# Patient Record
Sex: Male | Born: 1950
Health system: Southern US, Community
[De-identification: ages and names within clinical notes are randomized; demographics above are authoritative.]

## PROBLEM LIST (undated history)

## (undated) DIAGNOSIS — IMO0001 Reserved for inherently not codable concepts without codable children: Secondary | ICD-10-CM

## (undated) DIAGNOSIS — K219 Gastro-esophageal reflux disease without esophagitis: Secondary | ICD-10-CM

## (undated) DIAGNOSIS — Z974 Presence of external hearing-aid: Secondary | ICD-10-CM

## (undated) DIAGNOSIS — G309 Alzheimer's disease, unspecified: Secondary | ICD-10-CM

## (undated) DIAGNOSIS — R37 Sexual dysfunction, unspecified: Secondary | ICD-10-CM

## (undated) DIAGNOSIS — F0280 Dementia in other diseases classified elsewhere without behavioral disturbance: Secondary | ICD-10-CM

## (undated) HISTORY — PX: LUMBAR DISC SURGERY: SHX700

## (undated) HISTORY — DX: Gastro-esophageal reflux disease without esophagitis: K21.9

## (undated) HISTORY — DX: Sexual dysfunction, unspecified: R37

## (undated) HISTORY — PX: TONSILLECTOMY: SUR1361

## (undated) HISTORY — PX: COLONOSCOPY: SHX174

## (undated) HISTORY — PX: NEUROMA SURGERY: SHX722

## (undated) HISTORY — DX: Reserved for inherently not codable concepts without codable children: IMO0001

## (undated) HISTORY — PX: TONSILLECTOMY: SHX5217

---

## 1999-05-10 ENCOUNTER — Encounter (INDEPENDENT_AMBULATORY_CARE_PROVIDER_SITE_OTHER): Payer: Self-pay | Admitting: Specialist

## 1999-05-10 ENCOUNTER — Ambulatory Visit (HOSPITAL_COMMUNITY): Admission: RE | Admit: 1999-05-10 | Discharge: 1999-05-10 | Payer: Self-pay | Admitting: *Deleted

## 2000-04-20 ENCOUNTER — Ambulatory Visit (HOSPITAL_COMMUNITY): Admission: RE | Admit: 2000-04-20 | Discharge: 2000-04-20 | Payer: Self-pay | Admitting: Family Medicine

## 2000-04-20 ENCOUNTER — Encounter: Payer: Self-pay | Admitting: Family Medicine

## 2001-06-28 ENCOUNTER — Ambulatory Visit (HOSPITAL_COMMUNITY): Admission: RE | Admit: 2001-06-28 | Discharge: 2001-06-28 | Payer: Self-pay | Admitting: *Deleted

## 2001-06-28 ENCOUNTER — Encounter (INDEPENDENT_AMBULATORY_CARE_PROVIDER_SITE_OTHER): Payer: Self-pay | Admitting: *Deleted

## 2004-12-04 HISTORY — PX: CARDIOVASCULAR STRESS TEST: SHX262

## 2009-03-08 ENCOUNTER — Encounter: Admission: RE | Admit: 2009-03-08 | Discharge: 2009-03-08 | Payer: Self-pay | Admitting: Family Medicine

## 2009-12-20 ENCOUNTER — Encounter: Payer: Self-pay | Admitting: Cardiovascular Disease

## 2010-08-02 NOTE — Procedures (Signed)
[name]  Patient:    Brian [name], Brian [name]                       MRN: [id number] Proc. Date: 05/10/99 Adm. Date:  [id number] Attending:  Mingo Amber CC:         Meredith Staggers, M.D.                           Procedure Report  PROCEDURES:  Video upper endoscopy and video colonoscopy.  INDICATIONS:  A 60 year old male who reports intermittent rectal bleeding for 6-7 months with what he describes as red blood, clots, and black stool, which go on  intermittently for a few days at a time.  He also has been describing midepigastric pain while taking three maximum strength Bayer aspirin daily for back discomfort. He also drinks a six-pack of beer a day.  Preprocedure hemoglobin was 15.3. Helicobacter pylori antibody test was negative.  PREPARATION:  He was n.p.o. since midnight, having taken Phospho-Soda prep and  clear liquid diet.  The mucosa throughout was clean.  PREPROCEDURE SEDATION:  He received a total of 50 mg of Demerol and 5 mg of Versed prior to the upper endoscopy.  In addition, his throat was anesthetized with Hurricaine spray, and he was on 2 L of nasal cannula O2.  DESCRIPTION OF PROCEDURE:  The Olympus video upper endoscope was inserted via the mouth and advanced easily through the upper esophageal sphincter.  Intubation was then carried out easily to the descending duodenum.  On withdrawal, the mucosa as carefully evaluated.  The descending duodenum and bulb appeared normal.  In the  prepyloric area of the antrum was a 1 cm, healing, non-bleeding ulcer with a white base.  A CLO test was done, and the margins of the ulcer were biopsied.  The remainder of the stomach and retroflexed view of the GE junction appeared normal. On withdrawal through the esophagus, the Z-line was irregular with one eccentric patch of pink mucosa extending up to 38 cm.  This was biopsied to rule-out Barretts.  The remainder of the more  proximal esophagus appeared normal.  At the conclusion of this procedure, the patients position was reversed for procedure number two, a video colonoscopy.  He received an additional 25 mg of Demerol and 2.5 mg of Versed intravenously.  The Olympus video colonoscope was inserted via the rectum and advanced very easily through the entire colon to the cecum.  On withdrawal, the mucosa was carefully evaluated.  The entire right colon appeared normal.  There were minimal left-sided diverticula noted.  Retroflexed view of he rectum demonstrated grade II-III internal hemorrhoids, one of which prolapsed slightly.  There were no areas of inflammation, bleeding, or neoplasia noted. he patient tolerated both procedures well.  Pulse, blood pressure, and oximetry testing were stable throughout.  He was observed in recovery for 45 minutes and  discharged home, alert with a benign abdomen.  IMPRESSION: 1. Prepyloric ulcer of the stomach. 2. Irregular Z-line of the esophagus. 3. Minimal left-sided diverticula. 4. Grade II-III internal hemorrhoids, which likely account for most of his gastrointestinal bleeding.  PLAN:  The patient was given a booklet on hemorrhoidal care and advised to be on a high fiber diet.  He was also started on Protonix 40 mg q.d. and advised to minimize his aspirin intake.  He will return to the office in two weeks for followup. DD:  05/10/99 TD:  05/11/99 Job: [id number]

## 2011-06-06 ENCOUNTER — Encounter: Payer: Self-pay | Admitting: Cardiovascular Disease

## 2011-07-15 ENCOUNTER — Ambulatory Visit (INDEPENDENT_AMBULATORY_CARE_PROVIDER_SITE_OTHER): Payer: 59 | Admitting: Cardiovascular Disease

## 2011-07-15 ENCOUNTER — Encounter: Payer: Self-pay | Admitting: Cardiovascular Disease

## 2011-07-15 VITALS — BP 132/84 | HR 59 | Ht 73.0 in | Wt 206.8 lb

## 2011-07-15 DIAGNOSIS — R079 Chest pain, unspecified: Secondary | ICD-10-CM

## 2011-07-15 DIAGNOSIS — R0789 Other chest pain: Secondary | ICD-10-CM

## 2011-07-15 NOTE — Assessment & Plan Note (Signed)
Dear presents with episodes of chest diabetes the chest pains the past. The chest pain is somewhat disturbing. He was described as a very heavy sensation with radiation to his arm and jaw.   I am Concerned  that he may have some coronary artery disease. We'll schedule him for a stress echo.  I'll see him back in 3 months.

## 2011-07-15 NOTE — Progress Notes (Signed)
    Brian [name] ZOXWRUE Date of Birth  05-14-1950 [facility]     [place] [facility]  [address]    [address]   [address] [place], [place]  [address]    [place], [place]  [address] [phone]  Fax  [phone]  [phone]  Fax [phone]  Problem List: 1. Chest pain  History of Present Illness:  Brian [name] is a 61 y.o. gentleman with hx of CP.  Negative stress test in the past.  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 He was working several weeks ago and was walking the parameter. He developed some chest tightness. He took an aspirin and the pain resolved after about 20 minutes .  He describes the pain as a very heavy weight on his chest. He described it as an elephant on his chest.  The pain radiated up to his right shoulder and up to his right jaw.  He had no shortness of breath nausea or vomiting. He's been up to do all of his normal activities including hardware and has not had any any recurrent chest pain.    His cholesterol levels have been mildly elevated.  He started taking fish oil.    Current Outpatient Prescriptions on File Prior to Visit  Medication Sig Dispense Refill  . Loratadine (ALAVERT PO) Take by mouth daily.      Marland Kitchen NEXIUM 40 MG capsule Take 40 mg by mouth daily before breakfast.       . VIAGRA 100 MG tablet Take 100 mg by mouth as needed.         Allergies  Allergen Reactions  . Amoxicillin     Bothers Stomach    Past Medical History  Diagnosis Date  . Diabetes mellitus   . Reflux   . Sexual dysfunction     Corrected  . Melanotic neuroectodermal tumor     Past Surgical History  Procedure Date  . Tonsillectomy     October of 1969  . Neuroma surgery     Disc Repair  . Cardiovascular stress test 12-04-2004    EF 68%    History  Smoking status  . Never Smoker   Smokeless tobacco  . Not on file    History  Alcohol Use  . Yes    3 Beers per Day    History reviewed. No  pertinent family history.  Reviw of Systems:  Reviewed in the HPI.  All other systems are negative.  Physical Exam: Blood pressure 132/84, pulse 59, height 6\' 1"  (1.854 m), weight 206 lb 12.8 oz (93.804 kg). General: Well developed, well nourished, in no acute distress.  Head: Normocephalic, atraumatic, sclera non-icteric, mucus membranes are moist,   Neck: Supple. Carotids are 2 + without bruits. No JVD  Lungs: Clear bilaterally to auscultation.  Heart: regular rate.  normal  S1 S2. No murmurs, gallops or rubs.  Abdomen: Soft, non-tender, non-distended with normal bowel sounds. No hepatomegaly. No rebound/guarding. No masses.  Msk:  Strength and tone are normal  Extremities: No clubbing or cyanosis. No edema.  Distal pedal pulses are 2+ and equal bilaterally.  Neuro: Alert and oriented X 3. Moves all extremities spontaneously.  Psych:  Responds to questions appropriately with a normal affect.  ECG: 07/15/2011-sinus bradycardia. Otherwise the EKG is normal.  Assessment / Plan:

## 2011-07-15 NOTE — Patient Instructions (Signed)
Your physician recommends that you schedule a follow-up appointment in: 3 months  Your physician has requested that you have a stress echocardiogram.  Please follow instruction sheet as given.

## 2011-08-04 ENCOUNTER — Encounter: Payer: Self-pay | Admitting: Cardiovascular Disease

## 2011-08-04 ENCOUNTER — Ambulatory Visit (HOSPITAL_COMMUNITY): Payer: 59 | Attending: Cardiovascular Disease

## 2011-08-04 DIAGNOSIS — R072 Precordial pain: Secondary | ICD-10-CM

## 2011-08-04 DIAGNOSIS — R0789 Other chest pain: Secondary | ICD-10-CM

## 2011-08-04 DIAGNOSIS — Z8249 Family history of ischemic heart disease and other diseases of the circulatory system: Secondary | ICD-10-CM | POA: Insufficient documentation

## 2011-08-04 DIAGNOSIS — E785 Hyperlipidemia, unspecified: Secondary | ICD-10-CM | POA: Insufficient documentation

## 2011-08-05 ENCOUNTER — Telehealth: Payer: Self-pay | Admitting: Cardiovascular Disease

## 2011-08-05 NOTE — Telephone Encounter (Signed)
App set for  thur 5/ 30, decline sooner app in [place] branch with dr Elease Hashimoto.

## 2011-08-05 NOTE — Telephone Encounter (Signed)
Patient returning nurse call  From yesterday, regarding echo results.  Patient can be reached at [phone].

## 2011-08-05 NOTE — Telephone Encounter (Signed)
Message copied by Antony Odea on Tue Aug 05, 2011  9:25 AM ------      Message from: [place], [place] J      Created: Mon Aug 04, 2011  3:05 PM       The stress echo is suspicious for ischemia.  I think he needs a cardiac cath.  Set him for OV this week or next.

## 2011-08-14 ENCOUNTER — Encounter: Payer: Self-pay | Admitting: Cardiovascular Disease

## 2011-08-14 ENCOUNTER — Encounter: Payer: Self-pay | Admitting: *Deleted

## 2011-08-14 ENCOUNTER — Ambulatory Visit (INDEPENDENT_AMBULATORY_CARE_PROVIDER_SITE_OTHER): Payer: 59 | Admitting: Cardiovascular Disease

## 2011-08-14 VITALS — BP 120/80 | HR 60 | Ht 73.0 in | Wt 205.1 lb

## 2011-08-14 DIAGNOSIS — R9439 Abnormal result of other cardiovascular function study: Secondary | ICD-10-CM

## 2011-08-14 DIAGNOSIS — R079 Chest pain, unspecified: Secondary | ICD-10-CM

## 2011-08-14 LAB — BASIC METABOLIC PANEL
BUN: 17 mg/dL (ref 6–23)
CO2: 27 mEq/L (ref 19–32)
Calcium: 9.1 mg/dL (ref 8.4–10.5)
GFR: 124.46 mL/min (ref >60.00)
Glucose, Bld: 104 mg/dL — ABNORMAL HIGH (ref 70–99)
Sodium: 137 mEq/L (ref 135–145)

## 2011-08-14 LAB — CBC WITH DIFFERENTIAL/PLATELET
Basophils Absolute: 0 10*3/uL (ref 0.0–0.1)
Eosinophils Absolute: 0.1 10*3/uL (ref 0.0–0.7)
HCT: 42 % (ref 39.0–52.0)
Hemoglobin: 14 g/dL (ref 13.0–17.0)
Lymphocytes Relative: 34 % (ref 12.0–46.0)
Lymphs Abs: 1.5 10*3/uL (ref 0.7–4.0)
MCHC: 33.3 g/dL (ref 30.0–36.0)
Monocytes Relative: 10.1 % (ref 3.0–12.0)
Neutro Abs: 2.3 10*3/uL (ref 1.4–7.7)
Platelets: 212 10*3/uL (ref 150.0–400.0)
RDW: 14.1 % (ref 11.5–14.6)

## 2011-08-14 LAB — PROTIME-INR: Prothrombin Time: 10.1 s — ABNORMAL LOW (ref 10.2–12.4)

## 2011-08-14 NOTE — Patient Instructions (Addendum)
Your physician has requested that you have a cardiac catheterization. Cardiac catheterization is used to diagnose and/or treat various heart conditions. Doctors may recommend this procedure for a number of different reasons. The most common reason is to evaluate chest pain. Chest pain can be a symptom of coronary artery disease (CAD), and cardiac catheterization can show whether plaque is narrowing or blocking your heart's arteries. This procedure is also used to evaluate the valves, as well as measure the blood flow and oxygen levels in different parts of your heart. Please follow instruction sheet, as given.   Your physician recommends that you return for lab work in: today, bmet cbc pt/inr

## 2011-08-14 NOTE — Assessment & Plan Note (Addendum)
Brian [name] presents following an episode of chest discomfort. The pain lasted for 20 minutes and was described as an elephant on his chest. He had a stress echocardiogram which was abnormal. He did not have any specific segmental wall motion abnormalities but did not have any increase in his left ventricular function and he  he became very short of breath.  Given these results I think that he needs to have a heart catheterization. We discussed the risks, benefits, and options with hims. We'll draw  labs today. We'll schedule this for next week.

## 2011-08-14 NOTE — Progress Notes (Signed)
Brian [name] NWGNFAO Date of Birth  1951-01-31 [facility]     [place] Office  [address]    [address]   [address] [place], [place]  [address]    [place], [place]  [address] [phone]  Fax  [phone]  [phone]  Fax [phone]  Problem List: 1. Chest pain  History of Present Illness:  Brian [name] is a 61 y.o. gentleman with hx of CP.  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 He was working several weeks ago and was walking the parameter. He developed some chest tightness. He took an aspirin and the pain resolved after about 20 minutes .  He describes the pain as a very heavy weight on his chest. He described it as an elephant on his chest.  The pain radiated up to his right shoulder and up to his right jaw.  He had no shortness of breath nausea or vomiting. He's been up to do all of his normal activities including hardware and has not had any any recurrent chest pain.    His cholesterol levels have been mildly elevated.  He started taking fish oil.    . Had a stress echo since his last visit. He is normal left ventricular systolic function at rest. With exercise his LV  function did not improve. He became very short of breath during the stress test.  He's not had any further episodes of chest pain.  Current Outpatient Prescriptions on File Prior to Visit  Medication Sig Dispense Refill  . Loratadine (ALAVERT PO) Take by mouth daily.      Marland Kitchen NEXIUM 40 MG capsule Take 40 mg by mouth daily before breakfast.       . VIAGRA 100 MG tablet Take 100 mg by mouth as needed.         Allergies  Allergen Reactions  . Amoxicillin     Bothers Stomach    Past Medical History  Diagnosis Date  . Diabetes mellitus   . Reflux   . Sexual dysfunction     Corrected  . Melanotic neuroectodermal tumor     Past Surgical History  Procedure Date  . Tonsillectomy     October of 1969  . Neuroma surgery     Disc Repair  .  Cardiovascular stress test 12-04-2004    EF 68%    History  Smoking status  . Never Smoker   Smokeless tobacco  . Not on file    History  Alcohol Use  . Yes    3 Beers per Day    No family history on file.  Reviw of Systems:  Reviewed in the HPI.  All other systems are negative.  Physical Exam: Blood pressure 120/80, pulse 60, height 6\' 1"  (1.854 m), weight 205 lb 1.9 oz (93.042 kg). General: Well developed, well nourished, in no acute distress.  Head: Normocephalic, atraumatic, sclera non-icteric, mucus membranes are moist,   Neck: Supple. Carotids are 2 + without bruits. No JVD  Lungs: Clear bilaterally to auscultation.  Heart: regular rate.  normal  S1 S2. No murmurs, gallops or rubs.  Abdomen: Soft, non-tender, non-distended with normal bowel sounds. No hepatomegaly. No rebound/guarding. No masses.  Msk:  Strength and tone are normal  Extremities: No clubbing or cyanosis. No edema.  Distal pedal pulses are 2+ and equal bilaterally.  His radial pulses are 2+. His femoral pulses are 2+.  Neuro: Alert and oriented X 3. Moves  all extremities spontaneously.  Psych:  Responds to questions appropriately with a normal affect.  ECG: 07/15/2011-sinus bradycardia. Otherwise the EKG is normal.  Assessment / Plan:

## 2011-08-18 ENCOUNTER — Inpatient Hospital Stay (HOSPITAL_BASED_OUTPATIENT_CLINIC_OR_DEPARTMENT_OTHER)
Admission: RE | Admit: 2011-08-18 | Discharge: 2011-08-18 | Disposition: A | Discharge status: Home / Self Care | Payer: 59 | Source: Ambulatory Visit | Attending: Cardiovascular Disease | Admitting: Cardiovascular Disease

## 2011-08-18 ENCOUNTER — Encounter (HOSPITAL_BASED_OUTPATIENT_CLINIC_OR_DEPARTMENT_OTHER)
Admission: RE | Disposition: A | Discharge status: Home / Self Care | Payer: Self-pay | Source: Ambulatory Visit | Attending: Cardiovascular Disease

## 2011-08-18 DIAGNOSIS — R079 Chest pain, unspecified: Secondary | ICD-10-CM | POA: Insufficient documentation

## 2011-08-18 SURGERY — JV LEFT HEART CATHETERIZATION WITH CORONARY ANGIOGRAM
Anesthesia: Moderate Sedation

## 2011-08-18 MED ORDER — ASPIRIN 81 MG PO CHEW: 324.0000 mg | CHEWABLE_TABLET | ORAL | Status: DC

## 2011-08-18 MED ORDER — SODIUM CHLORIDE 0.9 % IV SOLN: 250.0000 mL | INTRAVENOUS | Status: DC | PRN

## 2011-08-18 MED ORDER — ONDANSETRON HCL 4 MG/2ML IJ SOLN: 4.0000 mg | Freq: Four times a day (QID) | INTRAMUSCULAR | Status: DC | PRN

## 2011-08-18 MED ORDER — SODIUM CHLORIDE 0.9 % IV SOLN
INTRAVENOUS | Status: AC
  Administered 2011-08-18: 08:00:00 via INTRAVENOUS

## 2011-08-18 MED ORDER — ACETAMINOPHEN 325 MG PO TABS: 650.0000 mg | ORAL_TABLET | ORAL | Status: DC | PRN

## 2011-08-18 MED ORDER — SODIUM CHLORIDE 0.9 % IJ SOLN: 3.0000 mL | INTRAMUSCULAR | Status: DC | PRN

## 2011-08-18 MED ORDER — SODIUM CHLORIDE 0.9 % IJ SOLN: 3.0000 mL | Freq: Two times a day (BID) | INTRAMUSCULAR | Status: DC

## 2011-08-18 NOTE — CV Procedure (Signed)
    Cardiac Cath Note  Brian [name] [id number] 1950-09-08  Procedure: Left Heart Cardiac Catheterization Note Indications:  Chest pain, abnormal stress echo  Procedure Details Consent: Obtained Time Out: Verified patient identification, verified procedure, site/side was marked, verified correct patient position, special equipment/implants available, Radiology Safety Procedures followed,  medications/allergies/relevent history reviewed, required imaging and test results available.  Performed   Medications: Fentanyl: 50 mcg IV  Versed: 2 mg IV  The right femoral artery was easily canulated using a modified Seldinger technique.  Hemodynamics:   LV pressure: 115/16 Aortic pressure: 112/56  Angiography   Left Main: smooth and normal.  Left anterior Descending: moderate - large in size, smooth and normal.  1st diagonal - smooth and normal.  Ramus Intermediate:  Large branch. Smooth and normal  Left Circumflex: large and dominant. Smooth and normal.  There are several small obtuse marginal branches that are normal  Right Coronary Artery: small and nondominant.  Smooth and normal.  The RCA gives off several small RV marginal branches that are normal  LV Gram: normal LV function.  EF 60-65 %  Complications: No apparent complications Patient did tolerate procedure well.  Conclusions:   1. Smooth and normal coronaries. 2. Normal LV function.   Brian [name], Brian [name]., MD, [facility] 08/18/2011, 9:04 AM Office - [phone] Pager [phone]

## 2011-08-18 NOTE — H&P (Signed)
  See note from Aug 14, 2011.  Pt with chest pain and abnormal stress echo.  Referred for cath  Discussed risks, benefits, options with patient .  He understands and agrees to proceed.  Vesta Mixer, Montez Hageman., MD, [facility] 08/18/2011, 8:42 AM Office - [phone] Pager [phone]

## 2011-08-26 ENCOUNTER — Encounter: Payer: Self-pay | Admitting: *Deleted

## 2011-09-23 ENCOUNTER — Encounter: Payer: Self-pay | Admitting: Cardiovascular Disease

## 2011-09-23 ENCOUNTER — Ambulatory Visit (INDEPENDENT_AMBULATORY_CARE_PROVIDER_SITE_OTHER): Payer: 59 | Admitting: Cardiovascular Disease

## 2011-09-23 VITALS — BP 130/79 | HR 64 | Ht 73.0 in | Wt 202.8 lb

## 2011-09-23 DIAGNOSIS — R0789 Other chest pain: Secondary | ICD-10-CM

## 2011-09-23 NOTE — Patient Instructions (Addendum)
Your physician wants you to follow-up in: 1 YEAR  You will receive a reminder letter in the mail two months in advance. If you don't receive a letter, please call our office to schedule the follow-up appointment.   Your physician recommends that you return for a FASTING lipid profile: 1 YEAR   

## 2011-09-23 NOTE — Progress Notes (Signed)
Brian [name] OZHYQMV Date of Birth  1950/05/25 [facility]     [place] Office  [address]    [address]   [address] [place], [place]  [address]    [place], [place]  [address] [phone]  Fax  [phone]  [phone]  Fax [phone]  Problem List: 1. Chest pain  History of Present Illness:  Brian [name] is a 61 y.o. gentleman with hx of CP.  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 He was working several weeks ago and was walking the parameter. He developed some chest tightness. He took an aspirin and the pain resolved after about 20 minutes .  He describes the pain as a very heavy weight on his chest. He described it as an elephant on his chest.  The pain radiated up to his right shoulder and up to his right jaw.  He had no shortness of breath nausea or vomiting. He's been up to do all of his normal activities including hardware and has not had any any recurrent chest pain.    His cholesterol levels have been mildly elevated.  He started taking fish oil.   Had a stress echo since his last visit. He is normal left ventricular systolic function at rest. With exercise his LV  function did not improve. He became very short of breath during the stress test.  He's not had any further episodes of chest pain.  Cardiac catheterization (08/18/2011) revealed smooth and normal coronary arteries.  He has normal left ventricular systolic function.  Current Outpatient Prescriptions on File Prior to Visit  Medication Sig Dispense Refill  . aspirin 325 MG tablet Take 325 mg by mouth daily.      . Loratadine (ALAVERT PO) Take by mouth daily.      Marland Kitchen NEXIUM 40 MG capsule Take 40 mg by mouth daily before breakfast.       . VIAGRA 100 MG tablet Take 100 mg by mouth as needed.         Allergies  Allergen Reactions  . Amoxicillin     Bothers Stomach    Past Medical History  Diagnosis Date  . Diabetes mellitus   . Reflux   . Sexual  dysfunction     Corrected  . Melanotic neuroectodermal tumor     Past Surgical History  Procedure Date  . Tonsillectomy     October of 1969  . Neuroma surgery     Disc Repair  . Cardiovascular stress test 12-04-2004    EF 68%    History  Smoking status  . Never Smoker   Smokeless tobacco  . Not on file    History  Alcohol Use  . Yes    3 Beers per Day    No family history on file.  Reviw of Systems:  Reviewed in the HPI.  All other systems are negative.  Physical Exam: Blood pressure 130/79, pulse 64, height 6\' 1"  (1.854 m), weight 202 lb 12.8 oz (91.989 kg). General: Well developed, well nourished, in no acute distress.  Head: Normocephalic, atraumatic, sclera non-icteric, mucus membranes are moist,   Neck: Supple. Carotids are 2 + without bruits. No JVD  Lungs: Clear bilaterally to auscultation.  Heart: regular rate.  normal  S1 S2. No murmurs, gallops or rubs.  Abdomen: Soft, non-tender, non-distended with normal bowel sounds. No hepatomegaly. No rebound/guarding. No masses.  Msk:  Strength and tone are normal  Extremities: No clubbing  or cyanosis. No edema.  Distal pedal pulses are 2+ and equal bilaterally.  His radial pulses are 2+. His femoral pulses are 2+.  Groin has healed normally  Neuro: Alert and oriented X 3. Moves all extremities spontaneously.  Psych:  Responds to questions appropriately with a normal affect.  ECG: 07/15/2011-sinus bradycardia. Otherwise the EKG is normal.  Assessment / Plan:

## 2011-09-23 NOTE — Assessment & Plan Note (Signed)
Derec seems to be doing well. I do not have an exhalation first chest pressure. I'll see him again in one year for followup office visit. We'll probably getting fasting lipids at that time. He'll call if he has any further problems.

## 2011-10-14 ENCOUNTER — Ambulatory Visit: Payer: 59 | Admitting: Cardiovascular Disease

## 2012-03-17 HISTORY — PX: CARDIAC CATHETERIZATION: SHX172

## 2012-09-15 ENCOUNTER — Ambulatory Visit: Payer: 59 | Admitting: Cardiovascular Disease

## 2012-11-19 ENCOUNTER — Encounter: Payer: Self-pay | Admitting: Cardiovascular Disease

## 2012-11-29 ENCOUNTER — Ambulatory Visit: Payer: 59 | Admitting: Cardiovascular Disease

## 2013-02-01 ENCOUNTER — Ambulatory Visit (INDEPENDENT_AMBULATORY_CARE_PROVIDER_SITE_OTHER): Payer: 59 | Admitting: Cardiovascular Disease

## 2013-02-01 ENCOUNTER — Encounter: Payer: Self-pay | Admitting: Cardiovascular Disease

## 2013-02-01 VITALS — BP 140/88 | HR 60 | Ht 73.0 in | Wt 195.0 lb

## 2013-02-01 DIAGNOSIS — R0789 Other chest pain: Secondary | ICD-10-CM

## 2013-02-01 DIAGNOSIS — R9439 Abnormal result of other cardiovascular function study: Secondary | ICD-10-CM

## 2013-02-01 DIAGNOSIS — E785 Hyperlipidemia, unspecified: Secondary | ICD-10-CM

## 2013-02-01 NOTE — Patient Instructions (Signed)
Your physician recommends that you return for lab work in: today   Your physician recommends that you continue on your current medications as directed. Please refer to the Current Medication list given to you today.   Your physician recommends that you schedule a follow-up appointment in: as needed basis

## 2013-02-01 NOTE — Progress Notes (Signed)
Brian [name] ZOXWRUE Date of Birth  Feb 12, 1951 [facility]     [place] Office  [address]    [address]   [address] [place], [place]  [address]    [place], [place]  [address] [phone]  Fax  [phone]  [phone]  Fax [phone]  Problem List: 1. Chest pain  History of Present Illness:  Brian [name] is a 61 y.o. gentleman with hx of CP.  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 He was working several weeks ago and was walking the parameter. He developed some chest tightness. He took an aspirin and the pain resolved after about 20 minutes .  He describes the pain as a very heavy weight on his chest. He described it as an elephant on his chest.  The pain radiated up to his right shoulder and up to his right jaw.  He had no shortness of breath nausea or vomiting. He's been up to do all of his normal activities including hardware and has not had any any recurrent chest pain.    His cholesterol levels have been mildly elevated.  He started taking fish oil.   Had a stress echo since his last visit. He is normal left ventricular systolic function at rest. With exercise his LV  function did not improve. He became very short of breath during the stress test.  He's not had any further episodes of chest pain.  Cardiac catheterization (08/18/2011) revealed smooth and normal coronary arteries.  He has normal left ventricular systolic function.  Nov. 18, 2014:  Brian [name] is doing well.  No further episodes of CP.  Current Outpatient Prescriptions on File Prior to Visit  Medication Sig Dispense Refill  . aspirin 325 MG tablet Take 325 mg by mouth daily.      . Loratadine (ALAVERT PO) Take by mouth daily.      Marland Kitchen NEXIUM 40 MG capsule Take 40 mg by mouth daily before breakfast.       . VIAGRA 100 MG tablet Take 100 mg by mouth as needed.        No current facility-administered medications on file prior to visit.    Allergies  Allergen  Reactions  . Amoxicillin     Bothers Stomach    Past Medical History  Diagnosis Date  . Diabetes mellitus   . Reflux   . Sexual dysfunction     Corrected  . Melanotic neuroectodermal tumor     Past Surgical History  Procedure Laterality Date  . Tonsillectomy      October of 1969  . Neuroma surgery      Disc Repair  . Cardiovascular stress test  12-04-2004    EF 68%    History  Smoking status  . Never Smoker   Smokeless tobacco  . Not on file    History  Alcohol Use  . Yes    Comment: 3 Beers per Day    No family history on file.  Reviw of Systems:  Reviewed in the HPI.  All other systems are negative.  Physical Exam: Blood pressure 140/88, pulse 60, height 6\' 1"  (1.854 m), weight 195 lb (88.451 kg). General: Well developed, well nourished, in no acute distress.  Head: Normocephalic, atraumatic, sclera non-icteric, mucus membranes are moist,   Neck: Supple. Carotids are 2 + without bruits. No JVD  Lungs: Clear bilaterally to auscultation.  Heart: regular rate.  normal  S1 S2. No murmurs, gallops  or rubs.  Abdomen: Soft, non-tender, non-distended with normal bowel sounds. No hepatomegaly. No rebound/guarding. No masses.  Msk:  Strength and tone are normal  Extremities: No clubbing or cyanosis. No edema.  Distal pedal pulses are 2+ and equal bilaterally.  His radial pulses are 2+. His femoral pulses are 2+.  Groin has healed normally  Neuro: Alert and oriented X 3. Moves all extremities spontaneously.  Psych:  Responds to questions appropriately with a normal affect.  ECG: Nov. 18, 2014:  NSR at 60.  Normal ,  Artifact   Assessment / Plan:

## 2013-02-01 NOTE — Assessment & Plan Note (Signed)
Brian [name] has had a normal cath last year.  He reports having a hx of hyperlipidemia at his last blood draw.   Will check fasting lipids, liver, BMP today.    He is no thaving any problems and we will follow up with him as needed.  He will see Dr. Azucena Cecil

## 2013-02-02 LAB — HEPATIC FUNCTION PANEL
AST: 24 U/L (ref 0–37)
Alkaline Phosphatase: 36 U/L — ABNORMAL LOW (ref 39–117)
Bilirubin, Direct: 0.1 mg/dL (ref 0.0–0.3)

## 2013-02-02 LAB — LIPID PANEL
LDL Cholesterol: 95 mg/dL (ref 0–99)
Total CHOL/HDL Ratio: 3

## 2013-02-02 LAB — BASIC METABOLIC PANEL
BUN: 11 mg/dL (ref 6–23)
CO2: 29 mEq/L (ref 19–32)
Calcium: 9.5 mg/dL (ref 8.4–10.5)
Chloride: 102 mEq/L (ref 96–112)
Creatinine, Ser: 0.8 mg/dL (ref 0.4–1.5)

## 2013-02-18 ENCOUNTER — Telehealth: Payer: Self-pay | Admitting: Cardiovascular Disease

## 2013-02-18 NOTE — Telephone Encounter (Signed)
New message ° ° ° °Returning nurses call °

## 2013-02-18 NOTE — Telephone Encounter (Signed)
Spoke with patient who states he had a message to call the office for lab results.  It was documented on the lab results that they were reviewed with the patient on 11/26 but patient denies.  I reviewed labs with patient who verbalized understanding and expressed gratitude.

## 2013-11-11 ENCOUNTER — Other Ambulatory Visit: Payer: Self-pay | Admitting: Family Medicine

## 2013-11-11 DIAGNOSIS — R413 Other amnesia: Secondary | ICD-10-CM

## 2013-11-18 ENCOUNTER — Other Ambulatory Visit: Payer: 59

## 2013-11-22 ENCOUNTER — Ambulatory Visit
Admission: RE | Admit: 2013-11-22 | Discharge: 2013-11-22 | Disposition: A | Discharge status: Home / Self Care | Payer: 59 | Source: Ambulatory Visit | Attending: Family Medicine | Admitting: Family Medicine

## 2013-11-22 DIAGNOSIS — R413 Other amnesia: Secondary | ICD-10-CM

## 2013-11-22 MED ORDER — GADOBENATE DIMEGLUMINE 529 MG/ML IV SOLN
17.0000 mL | Freq: Once | INTRAVENOUS | Status: AC | PRN
  Administered 2013-11-22: 17 mL via INTRAVENOUS

## 2013-12-12 ENCOUNTER — Encounter: Payer: Self-pay | Admitting: Neurology

## 2013-12-12 ENCOUNTER — Ambulatory Visit (INDEPENDENT_AMBULATORY_CARE_PROVIDER_SITE_OTHER): Payer: 59 | Admitting: Neurology

## 2013-12-12 VITALS — BP 150/80 | HR 60 | Resp 16 | Ht 73.25 in | Wt 194.0 lb

## 2013-12-12 DIAGNOSIS — R413 Other amnesia: Secondary | ICD-10-CM

## 2013-12-12 NOTE — Progress Notes (Addendum)
NEUROLOGY CONSULTATION NOTE  BANNER HUCKABA MRN: [id number] DOB: 05/13/1950  Referring provider: Dr. Antony Contras Primary care provider: Dr. Antony Contras  Reason for consult:  Memory loss  Dear Dr Moreen Fowler:  Thank you for your kind referral of Deshan [facility] for consultation of the above symptoms. Although his history is well known to you, please allow me to reiterate it for the purpose of our medical record. The patient was accompanied to the clinic by his wife who also provides collateral information. Records and images were personally reviewed where available.  HISTORY OF PRESENT ILLNESS: This is a very pleasant 63 year old right-handed man with a history of back surgery, melanoma, presenting for evaluation of memory loss over the past 8 months or so.  The patient reports that he has to "dig in my head to pull something out."  He is usually good with names but started forgetting them.  He has not gotten lost driving, but would get turned around, asking his wife how to get to a certain place. 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 He did not pass a qualification test for weapons because of depth perception. He saw his ophthalmologist and underwent cataract surgery in November 2014.  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 He had difficulties following instructions, and felt that the instructor was going so fast. He had a hard time filling out the bubbles on a multiple choice questionnaire. He got hired to work for [employer] last July, but came home one day telling his wife he had problems with his reports, that his supervisor was losing patience with him because he can't seem to do things.  He is currently on medical leave.  His wife  has also noticed that he would ask the same questions repeatedly.  He would say he fed their pets, but actually did not. He would forget to do chores at home such as bringing out the trash.  His wife has been in charge of bills for the past 1-1/2 years as a Lexicographer, they denied any missed bill payments. He states he has always had word-finding difficulties.  His wife became tearful, stating he became depressed after he lost his job in October. He has lost weight and become more quiet. He has also increased alcohol intake, drinking 1-6 beers daily.  They deny any other personality changes, no paranoia.  They note that when they went to the beach last August, he had no difficulties driving long-distance or when they were driving locally, however at home he always asks her how to get to a location, and would get confused/frustrated when it is not the usual route he is used to.    He denies any headaches, dizziness, diplopia, dysarthria, dysphagia, neck pain, bowel/bladder dysfunction. No anosmia, REM behavior disorder. He has mild bilateral hand tremors. He has been dealing with low back pain. He has sleep apnea and uses a CPAP regularly. No history of head injuries or falls. There is no family history of memory problems.  I personally reviewed MRI brain with and without contrast which showed patchy and confluent cerebral white matter T2 and FLAIR hyperintensities bilaterally, periatrial white matter is most affected, more  over the posterior hemispheres. There are multiple small chronic micro hemorrhages identified on series 12 with susceptibility weighted imaging. Mostly these are in the posterior hemispheres. They demonstrate no associated intrinsic T1 hyperintensity or postcontrast enhancement. No abnormal enhancement identified. Mesial temporal lobe structures are symmetric and within normal limits for age.   Laboratory Data: Per patient, B12 level recently checked was normal, results  unavailable for review.  PAST MEDICAL HISTORY: Past Medical History  Diagnosis Date  . Reflux   . Sexual dysfunction     Corrected  . Melanotic neuroectodermal tumor     PAST SURGICAL HISTORY: Past Surgical History  Procedure Laterality Date  . Tonsillectomy      October of 1969  . Neuroma surgery      Disc Repair  . Cardiovascular stress test  12-04-2004    EF 68%  . Lumbar disc surgery      MEDICATIONS: Current Outpatient Prescriptions on File Prior to Visit  Medication Sig Dispense Refill  . aspirin 325 MG tablet Take 325 mg by mouth daily.      . Loratadine (ALAVERT PO) Take by mouth daily.      Marland Kitchen VIAGRA 100 MG tablet Take 100 mg by mouth as needed.        No current facility-administered medications on file prior to visit.    ALLERGIES: Allergies  Allergen Reactions  . Amoxicillin     Bothers Stomach    FAMILY HISTORY: No family history on file.  SOCIAL HISTORY: History   Social History  . Marital Status: Single    Spouse Name: N/A    Number of Children: N/A  . Years of Education: N/A   Occupational History  . Not on file.   Social History Main Topics  . Smoking status: Never Smoker   . Smokeless tobacco: Not on file  . Alcohol Use: Yes     Comment: 3 Beers per Day  . Drug Use: No  . Sexual Activity: Not on file   Other Topics Concern  . Not on file   Social History Narrative  . No narrative on file    REVIEW OF SYSTEMS: Constitutional: No fevers, chills, or sweats, no generalized fatigue, change in appetite Eyes: No visual changes, double vision, eye pain Ear, nose and throat: No hearing loss, ear pain, nasal congestion, sore throat Cardiovascular: No chest pain, palpitations Respiratory:  No shortness of breath at rest or with exertion, wheezes GastrointestinaI: No nausea, vomiting, diarrhea, abdominal pain, fecal incontinence Genitourinary:  No dysuria, urinary retention or frequency Musculoskeletal:  No neck pain, +back  pain Integumentary: No rash, pruritus, skin lesions Neurological: as above Psychiatric: +depression, no insomnia, anxiety Endocrine: No palpitations, fatigue, diaphoresis, mood swings, change in appetite, change in weight, increased thirst Hematologic/Lymphatic:  No anemia, purpura, petechiae. Allergic/Immunologic: no itchy/runny eyes, nasal congestion, recent allergic reactions, rashes  PHYSICAL EXAM: Filed Vitals:   12/12/13 0910  BP: 150/80  Pulse: 60  Resp: 16   General: No acute distress Head:  Normocephalic/atraumatic Eyes: Fundoscopic exam shows bilateral sharp discs, no vessel changes, exudates, or hemorrhages Neck: supple, no paraspinal tenderness, full range of motion Back: No paraspinal tenderness Heart: regular rate and rhythm Lungs: Clear to auscultation bilaterally. Vascular: No carotid bruits. Skin/Extremities: No rash, no edema Neurological Exam: Mental status: alert and oriented to person, place, and time, no dysarthria or aphasia, Fund of knowledge is appropriate.  Remote memory intact.  Attention and concentration are normal.    Able to name objects and repeat  phrases.  Montreal Cognitive Assessment  12/12/2013  Visuospatial/ Executive (0/5) 1  Naming (0/3) 3  Attention: Read list of digits (0/2) 2  Attention: Read list of letters (0/1) 1  Attention: Serial 7 subtraction starting at 100 (0/3) 3  Language: Repeat phrase (0/2) 2  Language : Fluency (0/1) 0  Abstraction (0/2) 2  Delayed Recall (0/5) 0  Orientation (0/6) 5  Total 19  Adjusted Score (based on education) 19   Cranial nerves: CN I: not tested CN II: pupils equal, round and reactive to light, visual fields intact on individual gross field testing, however appears to have visual to double simultaneous stimulation on the left hemifield, fundi unremarkable. CN III, IV, VI:  full range of motion, no nystagmus, no ptosis CN V: facial sensation intact CN VII: upper and lower face symmetric CN VIII:  hearing intact to finger rub CN IX, X: gag intact, uvula midline CN XI: sternocleidomastoid and trapezius muscles intact CN XII: tongue midline Bulk & Tone: normal, no cogwheeling, no fasciculations. Motor: 5/5 throughout with no pronator drift. Sensation: intact to light touch, cold, pin, vibration and joint position sense.  No extinction to double simultaneous stimulation.  Romberg test negative Deep Tendon Reflexes: +1 throughout except for +2 left brachialis, absent ankle jerks bilaterally, no ankle clonus Plantar responses: downgoing bilaterally Cerebellar: no incoordination on finger to nose, heel to shin. No dysdiadochokinesia Gait: narrow-based and steady, able to tandem walk adequately. Tremor: none  IMPRESSION: This is a very pleasant 63 year old right-handed man with a history of back surgery, melanoma, presenting for worsening memory loss over the past 8 months. His neurological exam is non-focal except for possible visual extinction to double simultaneous stimulation on the left hemifield.  MRI brain did not show any acute changes, there were white matter changes seen more over the posterior head regions. MOCA score is 19/30 (normal >26/30), concerning for early onset mild dementia.  We discussed different causes of memory loss, bloodwork from his PCP will be requested for review. We discussed pseudodementia and effects of mood on memory, his wife has noticed more depression since losing his job in October 2014.  Neuropsychological evaluation will be ordered to further evaluate his symptoms and assess if mood is playing a bigger role.  I suspect he may have a dementia variant with the visual extinction noted on exam today.  We discussed the option for starting cholinesterase inhibitors such as Aricept, the side effects and expectations from the medication, and have agreed to hold off for now until after Neuropsych testing.  We discussed the importance of controlling vascular risk factors,  physical and brain stimulation exercises for brain health. He will follow-up in 2 months.  Thank you for allowing me to participate in the care of this patient. Please do not hesitate to call for any questions or concerns.   Ellouise Newer, M.D.  CC: Dr. Moreen Fowler   ADDENDUM 12/21/13: Bloodwork from PCP reviewed, CBC, CMP normal, vitamin B12 448, TSH 1.79.

## 2013-12-12 NOTE — Patient Instructions (Addendum)
1. Refer to Neuropsychological evaluation 2. Physical exercise and brain stimulation exercises are important for brain health 3. Avoid alcohol 4. Monitor your blood pressure regularly, keep a log 5. Follow-up after neuropsych testing

## 2013-12-15 ENCOUNTER — Telehealth: Payer: Self-pay | Admitting: *Deleted

## 2013-12-15 DIAGNOSIS — R413 Other amnesia: Secondary | ICD-10-CM

## 2013-12-15 NOTE — Telephone Encounter (Signed)
Called patient to make him aware that his ins is out of network with Dr. Jenne Campus office. Will refer him for neuropsych testing in [place]. He will receive a call for scheduling.

## 2013-12-15 NOTE — Telephone Encounter (Signed)
Christy from [facility] called in reference to the referral you placed on this patient please call Call back number [phone]

## 2014-02-01 DIAGNOSIS — R413 Other amnesia: Secondary | ICD-10-CM

## 2014-02-02 DIAGNOSIS — R413 Other amnesia: Secondary | ICD-10-CM

## 2014-02-13 ENCOUNTER — Ambulatory Visit (INDEPENDENT_AMBULATORY_CARE_PROVIDER_SITE_OTHER): Payer: 59 | Admitting: Neurology

## 2014-02-13 ENCOUNTER — Encounter: Payer: Self-pay | Admitting: Neurology

## 2014-02-13 VITALS — BP 134/72 | HR 69 | Resp 16 | Ht 73.25 in | Wt 193.0 lb

## 2014-02-13 DIAGNOSIS — F0390 Unspecified dementia without behavioral disturbance: Secondary | ICD-10-CM

## 2014-02-13 MED ORDER — DONEPEZIL HCL 10 MG PO TABS: ORAL_TABLET | ORAL | Status: DC

## 2014-02-13 NOTE — Patient Instructions (Signed)
1. Start Aricept 10mg : Take 1/2 tablet daily for 1 week, then increase to 1 tablet daily 2. Physical exercises and brain stimulation exercises are important for brain health 3. Follow-up in 3 months

## 2014-02-13 NOTE — Progress Notes (Signed)
NEUROLOGY FOLLOW UP OFFICE NOTE  Brian [name] [id number]  HISTORY OF PRESENT ILLNESS: I had the pleasure of seeing Brian [name] in follow-up in the neurology clinic on 02/13/2014.  The patient was last seen 2 months ago and is again accompanied by his wife today. He underwent Neuropsychological testing with results indicating serious impairments in multiple domains. He had prominent impairment of visual-perceptual processing, left-sided visual suppressions to double simultaneous stimulation though no signs of spatial inattention, visual and auditory memory impairment, deficits in verbal fluency, and executive dysfunction. There was no evidence of affective distress. Testing strongly indicated cerebral dysfunction suggestive of a neurodegenerative process, most consistent with a cortical dementia. Recommendations included assistance with more complex daily activities, not to drive until he undergoes a formal on-the-road driving evaluation, monitor mood. It was noted that he is clearly disabled from any type of competitive employment and will be for the forseeable future.   His symptoms are unchanged, he has good and bad days. He reports yesterday was a good day. He denies any headaches, dizziness, focal numbness/tingling/weakness. Sleep is good.  HPI:  This is a very pleasant 63 yo RH man with a history of back surgery, melanoma,with memory loss since the beginning of 2015. The patient reports that he has to "dig in my head to pull something out." He is usually good with names but started forgetting them. He has not gotten lost driving, but would get turned around, asking his wife how to get to a certain place. 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 He did not pass a qualification test for weapons because of depth perception.  He saw his ophthalmologist and underwent cataract surgery in November 2014. 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 He had difficulties following instructions, and felt that the instructor was going so fast. He had a hard time filling out the bubbles on a multiple choice questionnaire. He got hired to work for [employer] last July, but came home one day telling his wife he had problems with his reports, that his supervisor was losing patience with him because he can't seem to do things. He is currently on medical leave. His wife has also noticed that he would ask the same questions repeatedly. He would say he fed their pets, but actually did not. He would forget to do chores at home such as bringing out the trash. His wife has been in charge of bills for the past 1-1/2 years as a Lexicographer, they denied any missed bill payments. He states he has always had word-finding difficulties.No history of head injuries or falls. There is no family history of memory problems.  I personally reviewed MRI brain with and without contrast which showed patchy and confluent cerebral white matter T2 and FLAIR hyperintensities bilaterally, periatrial white matter is most affected, more over the posterior hemispheres. There are multiple small chronic micro hemorrhages identified on series 12 with susceptibility weighted imaging. Mostly these are in the posterior hemispheres. They demonstrate no associated intrinsic T1 hyperintensity or postcontrast enhancement. No abnormal enhancement identified. Mesial temporal lobe structures are symmetric and within normal limits for age.   Laboratory Data: 10/2013 B12 448, 06/2013 TSH 1.79, CBC and CMP normal.  PAST MEDICAL HISTORY: Past Medical History  Diagnosis Date  . Reflux   . Sexual dysfunction     Corrected  . Melanotic neuroectodermal tumor     MEDICATIONS: Current  Outpatient Prescriptions on File Prior to Visit  Medication Sig Dispense Refill  . aspirin 325 MG tablet Take 325 mg by mouth daily.    . Cyanocobalamin (VITAMIN B-12 PO) Take 5,000 mcg by mouth daily.     Marland Kitchen Dexlansoprazole (DEXILANT) 30 MG capsule Take 30 mg by mouth daily.    . Loratadine (ALAVERT PO) Take by mouth daily.    Marland Kitchen VIAGRA 100 MG tablet Take 100 mg by mouth as needed.      No current facility-administered medications on file prior to visit.    ALLERGIES: Allergies  Allergen Reactions  . Amoxicillin     Bothers Stomach    FAMILY HISTORY: No family history on file.  SOCIAL HISTORY: History   Social History  . Marital Status: Single    Spouse Name: N/A    Number of Children: N/A  . Years of Education: N/A   Occupational History  . Not on file.   Social History Main Topics  . Smoking status: Never Smoker   . Smokeless tobacco: Not on file  . Alcohol Use: Yes     Comment: 3 Beers per Day  . Drug Use: No  . Sexual Activity: Not on file   Other Topics Concern  . Not on file   Social History Narrative    REVIEW OF SYSTEMS: Constitutional: No fevers, chills, or sweats, no generalized fatigue, change in appetite Eyes: No visual changes, double vision, eye pain Ear, nose and throat: No hearing loss, ear pain, nasal congestion, sore throat Cardiovascular: No chest pain, palpitations Respiratory:  No shortness of breath at rest or with exertion, wheezes GastrointestinaI: No nausea, vomiting, diarrhea, abdominal pain, fecal incontinence Genitourinary:  No dysuria, urinary retention or frequency Musculoskeletal:  No neck pain, +back pain Integumentary: No rash, pruritus, skin lesions Neurological: as above Psychiatric: No depression, insomnia, anxiety Endocrine: No palpitations, fatigue, diaphoresis, mood swings, change in appetite, change in weight, increased thirst Hematologic/Lymphatic:  No anemia, purpura, petechiae. Allergic/Immunologic: no itchy/runny  eyes, nasal congestion, recent allergic reactions, rashes  PHYSICAL EXAM: Filed Vitals:   02/13/14 0817  BP: 134/72  Pulse: 69  Resp: 16   General: No acute distress Head:  Normocephalic/atraumatic Neck: supple, no paraspinal tenderness, full range of motion Heart:  Regular rate and rhythm Lungs:  Clear to auscultation bilaterally Back: No paraspinal tenderness Skin/Extremities: No rash, no edema Neurological Exam: alert and oriented to person, place, states date is Feb 03, 2014 (it is Nov 30), knows day of week, president. No aphasia or dysarthria. Fund of knowledge is appropriate.  Remote memory intact. 1/3 delayed recall.  Attention and concentration are normal.    Able to name objects and repeat phrases. Cranial nerves: pupils equal, round and reactive to light, visual fields intact on individual gross field testing, however appears to have visual to double simultaneous stimulation on the left hemifield (similar to prior, not consistent but noted again today), fundi unremarkable.CN III, IV, VI: full range of motion, no nystagmus, no ptosis CN V: facial sensation intact CN VII: upper and lower face symmetric CN VIII: hearing intact to voice. CN IX, X: gag intact, uvula midline CN XI: sternocleidomastoid and trapezius muscles intact CN XII: tongue midline Bulk & Tone: normal, no cogwheeling, no fasciculations. Motor: 5/5 throughout with no pronator drift. Sensation: intact  to light touch. No extinction to double simultaneous stimulation. Romberg test negative Deep Tendon Reflexes: +1 throughout, absent ankle jerks bilaterally, no ankle clonus Plantar responses: downgoing bilaterally Cerebellar: no incoordination on finger to nose Gait: narrow-based and steady, able to tandem walk adequately. Tremor: none  IMPRESSION: This is a very pleasant 63 yo RH man with a history of back surgery, melanoma, who presented with worsening memory loss. His neurological exam is non-focal except for possible  visual extinction to double simultaneous stimulation on the left hemifield. He was also noted to have similar left-sided visual suppressions to double simultaneous stimulation during Neuropsych evaluation. MRI brain did not show any acute changes, there were white matter changes seen more over the posterior head regions, which can cause visual spatial perception difficulties, suggestive more of a subcortical dementia, although his Neuropsych evaluation reported results most consistent with a cortical dementia. We discussed the diagnosis of early dementia, expectations and prognosis. He may benefit from starting a cholinesterase inhibitor such as Aricept. Side effects were discussed, he will start Aricept 5mg  daily for 1 week, then increase to 10mg  daily. We again discussed the importance of controlling vascular risk factors, physical and brain stimulation exercises for brain health. He will follow-up in 3 months.  Thank you for allowing me to participate in his care.  Please do not hesitate to call for any questions or concerns.  The duration of this appointment visit was 25 minutes of face-to-face time with the patient.  Greater than 50% of this time was spent in counseling, explanation of diagnosis, planning of further management, and coordination of care.   Ellouise Newer, M.D.   CC: Dr. Moreen Fowler

## 2014-02-15 ENCOUNTER — Encounter: Payer: Self-pay | Admitting: Neurology

## 2014-04-09 ENCOUNTER — Emergency Department (HOSPITAL_COMMUNITY): Payer: 59

## 2014-04-09 ENCOUNTER — Encounter (HOSPITAL_COMMUNITY): Payer: Self-pay | Admitting: Emergency Medicine

## 2014-04-09 ENCOUNTER — Emergency Department (HOSPITAL_COMMUNITY)
Admission: EM | Admit: 2014-04-09 | Discharge: 2014-04-09 | Disposition: A | Discharge status: Home / Self Care | Payer: 59 | Attending: Emergency Medicine | Admitting: Emergency Medicine

## 2014-04-09 DIAGNOSIS — S52592A Other fractures of lower end of left radius, initial encounter for closed fracture: Secondary | ICD-10-CM | POA: Diagnosis not present

## 2014-04-09 DIAGNOSIS — S6992XA Unspecified injury of left wrist, hand and finger(s), initial encounter: Secondary | ICD-10-CM | POA: Diagnosis present

## 2014-04-09 DIAGNOSIS — W1839XA Other fall on same level, initial encounter: Secondary | ICD-10-CM | POA: Diagnosis not present

## 2014-04-09 DIAGNOSIS — K219 Gastro-esophageal reflux disease without esophagitis: Secondary | ICD-10-CM | POA: Insufficient documentation

## 2014-04-09 DIAGNOSIS — Y998 Other external cause status: Secondary | ICD-10-CM | POA: Diagnosis not present

## 2014-04-09 DIAGNOSIS — Z79899 Other long term (current) drug therapy: Secondary | ICD-10-CM | POA: Insufficient documentation

## 2014-04-09 DIAGNOSIS — S52612A Displaced fracture of left ulna styloid process, initial encounter for closed fracture: Secondary | ICD-10-CM | POA: Insufficient documentation

## 2014-04-09 DIAGNOSIS — Z7982 Long term (current) use of aspirin: Secondary | ICD-10-CM | POA: Insufficient documentation

## 2014-04-09 DIAGNOSIS — Y9389 Activity, other specified: Secondary | ICD-10-CM | POA: Diagnosis not present

## 2014-04-09 DIAGNOSIS — Y9289 Other specified places as the place of occurrence of the external cause: Secondary | ICD-10-CM | POA: Diagnosis not present

## 2014-04-09 DIAGNOSIS — G309 Alzheimer's disease, unspecified: Secondary | ICD-10-CM | POA: Insufficient documentation

## 2014-04-09 DIAGNOSIS — S62102A Fracture of unspecified carpal bone, left wrist, initial encounter for closed fracture: Secondary | ICD-10-CM

## 2014-04-09 DIAGNOSIS — Z880 Allergy status to penicillin: Secondary | ICD-10-CM | POA: Diagnosis not present

## 2014-04-09 DIAGNOSIS — S3992XA Unspecified injury of lower back, initial encounter: Secondary | ICD-10-CM | POA: Insufficient documentation

## 2014-04-09 DIAGNOSIS — W19XXXA Unspecified fall, initial encounter: Secondary | ICD-10-CM

## 2014-04-09 HISTORY — DX: Dementia in other diseases classified elsewhere, unspecified severity, without behavioral disturbance, psychotic disturbance, mood disturbance, and anxiety: F02.80

## 2014-04-09 HISTORY — DX: Alzheimer's disease, unspecified: G30.9

## 2014-04-09 NOTE — Discharge Instructions (Signed)
Please call your doctor for a followup appointment within 24-48 hours. When you talk to your doctor please let them know that you were seen in the emergency department and have them acquire all of your records so that they can discuss the findings with you and formulate a treatment plan to fully care for your new and ongoing problems. Please call Dr. Burney Gauze office first thing tomorrow morning to set up an appointment As per Dr. Burney Gauze, reported that surgery most likely occur on Wednesday, 04/12/2014. Please keep arm elevated at all times to help reduction swelling and to prevent compartment syndrome Please ice Please continue to monitor symptoms closely and if symptoms are to worsen or change (fever greater than 101, chills, sweating, nausea, vomiting, chest pain, shortness of breathe, difficulty breathing, weakness, numbness, tingling, worsening or changes to pain pattern, fall, injury, changes to skin colored the fingers, decreased range of motion to the fingers, increased pain) please report back to the Emergency Department immediately.    Cast or Splint Care Casts and splints support injured limbs and keep bones from moving while they heal.  HOME CARE  Keep the cast or splint uncovered during the drying period.  A plaster cast can take 24 to 48 hours to dry.  A fiberglass cast will dry in less than 1 hour.  Do not rest the cast on anything harder than a pillow for 24 hours.  Do not put weight on your injured limb. Do not put pressure on the cast. Wait for your doctor's approval.  Keep the cast or splint dry.  Cover the cast or splint with a plastic bag during baths or wet weather.  If you have a cast over your chest and belly (trunk), take sponge baths until the cast is taken off.  If your cast gets wet, dry it with a towel or blow dryer. Use the cool setting on the blow dryer.  Keep your cast or splint clean. Wash a dirty cast with a damp cloth.  Do not put any objects under  your cast or splint.  Do not scratch the skin under the cast with an object. If itching is a problem, use a blow dryer on a cool setting over the itchy area.  Do not trim or cut your cast.  Do not take out the padding from inside your cast.  Exercise your joints near the cast as told by your doctor.  Raise (elevate) your injured limb on 1 or 2 pillows for the first 1 to 3 days. GET HELP IF:  Your cast or splint cracks.  Your cast or splint is too tight or too loose.  You itch badly under the cast.  Your cast gets wet or has a soft spot.  You have a bad smell coming from the cast.  You get an object stuck under the cast.  Your skin around the cast becomes red or sore.  You have new or more pain after the cast is put on. GET HELP RIGHT AWAY IF:  You have fluid leaking through the cast.  You cannot move your fingers or toes.  Your fingers or toes turn blue or white or are cool, painful, or puffy (swollen).  You have tingling or lose feeling (numbness) around the injured area.  You have bad pain or pressure under the cast.  You have trouble breathing or have shortness of breath.  You have chest pain. Document Released: 07/03/2010 Document Revised: 11/03/2012 Document Reviewed: 09/09/2012 [facility] Patient Information 2015 [place], [place].  This information is not intended to replace advice given to you by your health care provider. Make sure you discuss any questions you have with your health care provider. Wrist Fracture A wrist fracture is a break or crack in one of the bones of your wrist. Your wrist is made up of eight small bones at the palm of your hand (carpal bones) and two long bones that make up your forearm (radius and ulna).  CAUSES   A direct blow to the wrist.  Falling on an outstretched hand.  Trauma, such as a car accident or a fall. RISK FACTORS Risk factors for wrist fracture include:   Participating in contact and high-risk sports, such as skiing,  biking, and ice skating.  Taking steroid medicines.  Smoking.  Being male.  Being Caucasian.  Drinking more than three alcoholic beverages per day.  Having low or lowered bone density (osteoporosis or osteopenia).  Age. Older adults have decreased bone density.  Women who have had menopause.  History of previous fractures. SIGNS AND SYMPTOMS Symptoms of wrist fractures include tenderness, bruising, and inflammation. Additionally, the wrist may hang in an odd position or appear deformed.  DIAGNOSIS Diagnosis may include:  Physical exam.  X-ray. TREATMENT Treatment depends on many factors, including the nature and location of the fracture, your age, and your activity level. Treatment for wrist fracture can be nonsurgical or surgical.  Nonsurgical Treatment A plaster cast or splint may be applied to your wrist if the bone is in a good position. If the fracture is not in good position, it may be necessary for your health care provider to realign it before applying a splint or cast. Usually, a cast or splint will be worn for several weeks.  Surgical Treatment Sometimes the position of the bone is so far out of place that surgery is required to apply a device to hold it together as it heals. Depending on the fracture, there are a number of options for holding the bone in place while it heals, such as a cast and metal pins.  HOME CARE INSTRUCTIONS  Keep your injured wrist elevated and move your fingers as much as possible.  Do not put pressure on any part of your cast or splint. It may break.   Use a plastic bag to protect your cast or splint from water while bathing or showering. Do not lower your cast or splint into water.  Take medicines only as directed by your health care provider.  Keep your cast or splint clean and dry. If it becomes wet, damaged, or suddenly feels too tight, contact your health care provider right away.  Do not use any tobacco products including  cigarettes, chewing tobacco, or electronic cigarettes. Tobacco can delay bone healing. If you need help quitting, ask your health care provider.  Keep all follow-up visits as directed by your health care provider. This is important.  Ask your health care provider if you should take supplements of calcium and vitamins C and D to promote bone healing. SEEK MEDICAL CARE IF:   Your cast or splint is damaged, breaks, or gets wet.  You have a fever.  You have chills.  You have continued severe pain or more swelling than you did before the cast was put on. SEEK IMMEDIATE MEDICAL CARE IF:   Your hand or fingernails on the injured arm turn blue or gray, or feel cold or numb.  You have decreased feeling in the fingers of your injured arm. MAKE SURE  YOU:  Understand these instructions.  Will watch your condition.  Will get help right away if you are not doing well or get worse. Document Released: 12/11/2004 Document Revised: 07/18/2013 Document Reviewed: 03/21/2011 [facility] Patient Information 2015 [place], [place]. This information is not intended to replace advice given to you by your health care provider. Make sure you discuss any questions you have with your health care provider.

## 2014-04-09 NOTE — ED Notes (Signed)
Fell last night, landed on left hand, hx of previous left wrist fracture. Wrist swollen, obvious deformity, no bruising or redness.

## 2014-04-09 NOTE — ED Provider Notes (Addendum)
CSN: [id number]     Arrival date & time 04/09/14  1056 History   First MD Initiated Contact with Patient 04/09/14 1117     Chief Complaint  Patient presents with  . Wrist Injury  . Fall     (Consider location/radiation/quality/duration/timing/severity/associated sxs/prior Treatment) The history is provided by the patient. No language interpreter was used.  Brian [name] is a 64 y/o M with PMHx of reflux, Alzheimer's, Melanotic neuroectodermal tumor presenting to the ED with left wrist pain after patient fell yesterday evening. Patient reported that he was walking his dog and slipped on ice-reported that he tried to break his fall with his left wrist without ability. Stated that he noticed pain to the left wrist-reported deformity as well as constant tenderness. Stated that he noticed tingling in his fingers last p.m. that is now resolved. Wife who accompanies patient reported that they have fused Advil, tramadol, ice and elevation last night. Patient reported that he had a fracture to the same wrist 2 years ago with the same mechanism. Stated that he has some left hip soreness as well. Denied numbness, tingling, loss of sensation, elbow pain, shoulder pain, neck pain, neck stiffness, head injury, loss conscious, blurred vision, sudden loss of vision, chest pain, shortness of breath, difficulty breathing, dizziness, leg pain. PCP Dr. Moreen Fowler   Past Medical History  Diagnosis Date  . Reflux   . Sexual dysfunction     Corrected  . Melanotic neuroectodermal tumor   . Alzheimer disease    Past Surgical History  Procedure Laterality Date  . Tonsillectomy      October of 1969  . Neuroma surgery      Disc Repair  . Cardiovascular stress test  12-04-2004    EF 68%  . Lumbar disc surgery     No family history on file. History  Substance Use Topics  . Smoking status: Never Smoker   . Smokeless tobacco: Not on file  . Alcohol Use: Yes     Comment: 3 Beers per Day    Review of Systems    Constitutional: Negative for fever and chills.  Respiratory: Negative for chest tightness and shortness of breath.   Cardiovascular: Negative for chest pain.  Musculoskeletal: Positive for back pain and arthralgias (left wrist pain). Negative for myalgias.  Neurological: Positive for numbness. Negative for weakness.      Allergies  Amoxicillin  Home Medications   Prior to Admission medications   Medication Sig Start Date End Date Taking? Authorizing Provider  aspirin 325 MG tablet Take 325 mg by mouth daily.   Yes Historical Provider, MD  Cyanocobalamin (VITAMIN B-12 PO) Take 5,000 mcg by mouth daily.    Yes Historical Provider, MD  DEXILANT 60 MG capsule Take 1 capsule by mouth daily. 01/23/14  Yes Historical Provider, MD  donepezil (ARICEPT) 10 MG tablet Take 1/2 tablet daily for 1 week, then increase to 1 tablet daily 02/13/14  Yes Cameron Sprang, MD  VIAGRA 100 MG tablet Take 100 mg by mouth as needed for erectile dysfunction.  06/20/11  Yes Historical Provider, MD  Dexlansoprazole (DEXILANT) 30 MG capsule Take 30 mg by mouth daily.    Historical Provider, MD  Loratadine (ALAVERT PO) Take by mouth daily.    Historical Provider, MD   BP 131/79 mmHg  Pulse 62  Temp(Src) 97.9 F (36.6 C) (Oral)  Resp 16  SpO2 97% Physical Exam  Constitutional: He is oriented to person, place, and time. He appears well-developed and well-nourished.  No distress.  HENT:  Head: Normocephalic and atraumatic.  Eyes: Conjunctivae and EOM are normal. Pupils are equal, round, and reactive to light. Right eye exhibits no discharge. Left eye exhibits no discharge.  Neck: Normal range of motion. Neck supple. No tracheal deviation present.  Negative neck stiffness Negative nuchal rigidity Negative cervical lymphadenopathy Negative tenderness upon palpation to the C-spine  Cardiovascular: Normal rate, regular rhythm and normal heart sounds.  Exam reveals no friction rub.   No murmur heard. Pulses:       Radial pulses are 2+ on the right side, and 2+ on the left side.  Cap refill less than 3 seconds  Pulmonary/Chest: Effort normal and breath sounds normal. No respiratory distress. He has no wheezes. He has no rales.  Musculoskeletal: He exhibits edema and tenderness.       Left wrist: He exhibits decreased range of motion, tenderness, bony tenderness, swelling and deformity. He exhibits no effusion, no crepitus and no laceration.       Arms: Obvious deformity with swelling and ecchymosis identified to the left wrist. Patient holding wrist close to him and refuses to move the wrist secondary to pain. Negative changes to skin color, ischemia identified to the fingers of the left hand. Full range of motion to the digits of the left hand without difficulty. Full range of motion to left elbow, left shoulder without difficulty.  Full range of motion to the lower extremities bilaterally without difficulty or ataxia.  Lymphadenopathy:    He has no cervical adenopathy.  Neurological: He is alert and oriented to person, place, and time. No cranial nerve deficit. He exhibits normal muscle tone. Coordination normal.  Cranial nerves III-XII grossly intact Strength 5+/5+ to upper extremities bilaterally with resistance applied, equal distribution noted Strength intact to MCP, PIP, DIP joints of left hand Sensation intact with differentiation to sharp and dull touch  Skin: Skin is warm and dry. No rash noted. He is not diaphoretic. No erythema.  Psychiatric: He has a normal mood and affect. His behavior is normal. Thought content normal.  Nursing note and vitals reviewed.   ED Course  Procedures (including critical care time) Labs Review Labs Reviewed - No data to display  Imaging Review Dg Lumbar Spine Complete  04/09/2014   CLINICAL DATA:  Low left back pain after falling yesterday. Initial encounter.  EXAM: LUMBAR SPINE - COMPLETE 4+ VIEW  COMPARISON:  Office radiographs 03/29/2009.  Lumbar MRI  03/08/2009.  FINDINGS: There are 5 lumbar type vertebral bodies. The alignment is stable and near anatomic. There is no evidence of acute fracture or pars defect. Chronic disc space loss is present at L5-S1 with endplate sclerosis and vacuum phenomenon. There are probable postsurgical changes posteriorly at L4-5 and L5-S1.  IMPRESSION: No acute osseous findings or malalignment. Stable lower lumbar spondylosis and postsurgical changes.   Electronically Signed   By: Camie Patience M.D.   On: 04/09/2014 12:05   Dg Wrist Complete Left  04/09/2014   CLINICAL DATA:  Fall last night with pain of left wrist.  EXAM: LEFT WRIST - COMPLETE 3+ VIEW  COMPARISON:  None.  FINDINGS: Acute comminuted fracture of the distal radial metaphysis extends into the radiocarpal joint. This fracture shows mild dorsal angulation as well as impaction. Associated avulsion of the tip of the ulnar styloid with minimal displacement. No other injuries identified.  IMPRESSION: Comminuted fracture of the left distal radial metaphysis which extends into the radiocarpal joint and shows mild dorsal angulation. Associated avulsion fracture  at the tip of the ulnar styloid.   Electronically Signed   By: Aletta Edouard M.D.   On: 04/09/2014 12:00   Dg Hip Unilat With Pelvis 2-3 Views Left  04/09/2014   CLINICAL DATA:  Fall last night with left hip pain. Initial encounter.  EXAM: DG HIP W/ PELVIS 2-3V*L*  COMPARISON:  None.  FINDINGS: No acute fracture or dislocation identified. The left hip joint shows no significant degenerative changes. The bony pelvis has a normal appearance. No bony lesions or destruction. Soft tissues are unremarkable.  IMPRESSION: No acute fracture identified.   Electronically Signed   By: Aletta Edouard M.D.   On: 04/09/2014 12:03     EKG Interpretation None       12:49 PM This provider spoke with Dr. Burney Gauze - hand specialist. Discussed case, imaging in great detail. As per physician, recommended patient be placed in  sugar tong splint and for patient to call office tomorrow morning. Reported that patient will need surgery for corrective repair-reported surgery will most likely be done on Wednesday.  MDM   Final diagnoses:  Fall  Left wrist fracture, closed, initial encounter    Medications - No data to display  Filed Vitals:   04/09/14 1101  BP: 131/79  Pulse: 62  Temp: 97.9 F (36.6 C)  TempSrc: Oral  Resp: 16  SpO2: 97%   Plain film of lumbar spine negative for acute osseous injury or malalignment. Lower lumbar spondylosis and postsurgical changes. Plain film of left hip no acute osseous injury noted. Left wrist noted comminuted fracture of the left distal radial metaphysis which extends into the radiocarpal joint and shows mild dorsal angulation-associated avulsion fracture at the tip of the ulnar styloid noted.  This provider spoke with Dr. Burney Gauze, on-call physician for hand. As per physician, recommended patient be placed in sugar tong splint and follow-up as outpatient. Reported that surgery will most likely commence on Wednesday of this week. Negative signs of ischemia. Negative signs of compartment syndrome. Negative focal neurological deficits identified. Cap refill less than 3 seconds. Pulses palpable and strong.Patient presenting to the ED with comminuted fracture with distal angulation to the left wrist. Patient placed in sugar tong splint and sling immobilizer by [employer]. Patient stable, afebrile. Patient not septic appearing. Discharged patient. Discussed with patient to call Dr. Burney Gauze office first thing tomorrow morning to set up an appointment - hand specialist is aware of patient and situation. Reported that this will need to be repaired with surgery-patient understood and agreed to plan. Discussed with patient to rest, ice, elevate. Discussed with patient to closely monitor symptoms and if symptoms are to worsen or change to report back to the ED - strict return instructions given.   Patient agreed to plan of care, understood, all questions answered.   Jamse Mead, PA-C 04/09/14 1354  Malvin Johns, MD 04/09/14 [address] PA-C 04/21/14 [place], MD 04/21/14 1650

## 2014-04-11 ENCOUNTER — Encounter (HOSPITAL_BASED_OUTPATIENT_CLINIC_OR_DEPARTMENT_OTHER): Payer: Self-pay | Admitting: *Deleted

## 2014-04-11 ENCOUNTER — Other Ambulatory Visit: Payer: Self-pay | Admitting: Orthopedic Surgery

## 2014-04-11 NOTE — Progress Notes (Signed)
Pt has mild memory loss-no labs needed

## 2014-04-12 ENCOUNTER — Ambulatory Visit (HOSPITAL_BASED_OUTPATIENT_CLINIC_OR_DEPARTMENT_OTHER): Payer: 59 | Admitting: Certified Registered"

## 2014-04-12 ENCOUNTER — Ambulatory Visit (HOSPITAL_BASED_OUTPATIENT_CLINIC_OR_DEPARTMENT_OTHER)
Admission: RE | Admit: 2014-04-12 | Discharge: 2014-04-12 | Disposition: A | Discharge status: Home / Self Care | Payer: 59 | Source: Ambulatory Visit | Attending: Orthopedic Surgery | Admitting: Orthopedic Surgery

## 2014-04-12 ENCOUNTER — Encounter (HOSPITAL_BASED_OUTPATIENT_CLINIC_OR_DEPARTMENT_OTHER): Payer: Self-pay | Admitting: Certified Registered"

## 2014-04-12 ENCOUNTER — Encounter (HOSPITAL_BASED_OUTPATIENT_CLINIC_OR_DEPARTMENT_OTHER)
Admission: RE | Disposition: A | Discharge status: Home / Self Care | Payer: Self-pay | Source: Ambulatory Visit | Attending: Orthopedic Surgery

## 2014-04-12 DIAGNOSIS — Y9389 Activity, other specified: Secondary | ICD-10-CM | POA: Insufficient documentation

## 2014-04-12 DIAGNOSIS — G309 Alzheimer's disease, unspecified: Secondary | ICD-10-CM | POA: Insufficient documentation

## 2014-04-12 DIAGNOSIS — Z881 Allergy status to other antibiotic agents status: Secondary | ICD-10-CM | POA: Insufficient documentation

## 2014-04-12 DIAGNOSIS — Y998 Other external cause status: Secondary | ICD-10-CM | POA: Diagnosis not present

## 2014-04-12 DIAGNOSIS — F0280 Dementia in other diseases classified elsewhere without behavioral disturbance: Secondary | ICD-10-CM | POA: Insufficient documentation

## 2014-04-12 DIAGNOSIS — S52572A Other intraarticular fracture of lower end of left radius, initial encounter for closed fracture: Secondary | ICD-10-CM | POA: Diagnosis not present

## 2014-04-12 DIAGNOSIS — W19XXXA Unspecified fall, initial encounter: Secondary | ICD-10-CM | POA: Insufficient documentation

## 2014-04-12 DIAGNOSIS — K219 Gastro-esophageal reflux disease without esophagitis: Secondary | ICD-10-CM | POA: Insufficient documentation

## 2014-04-12 DIAGNOSIS — Y9289 Other specified places as the place of occurrence of the external cause: Secondary | ICD-10-CM | POA: Diagnosis not present

## 2014-04-12 HISTORY — PX: CARPAL TUNNEL RELEASE: SHX101

## 2014-04-12 HISTORY — PX: OPEN REDUCTION INTERNAL FIXATION (ORIF) DISTAL RADIAL FRACTURE: SHX5989

## 2014-04-12 HISTORY — DX: Presence of external hearing-aid: Z97.4

## 2014-04-12 SURGERY — OPEN REDUCTION INTERNAL FIXATION (ORIF) DISTAL RADIUS FRACTURE
Anesthesia: General | Site: Wrist | Laterality: Left

## 2014-04-12 MED ORDER — LIDOCAINE-EPINEPHRINE (PF) 1.5 %-1:200000 IJ SOLN
INTRAMUSCULAR | Status: DC | PRN
  Administered 2014-04-12: 25 mL via PERINEURAL

## 2014-04-12 MED ORDER — DEXAMETHASONE SODIUM PHOSPHATE 10 MG/ML IJ SOLN
INTRAMUSCULAR | Status: DC | PRN
Start: 2014-04-12 — End: 2014-04-12
  Administered 2014-04-12: 10 mg via INTRAVENOUS

## 2014-04-12 MED ORDER — FENTANYL CITRATE 0.05 MG/ML IJ SOLN
INTRAMUSCULAR | Status: AC
  Filled 2014-04-12: qty 6

## 2014-04-12 MED ORDER — FENTANYL CITRATE 0.05 MG/ML IJ SOLN
50.0000 ug | INTRAMUSCULAR | Status: DC | PRN
  Administered 2014-04-12: 50 ug via INTRAVENOUS

## 2014-04-12 MED ORDER — LACTATED RINGERS IV SOLN
INTRAVENOUS | Status: DC
  Administered 2014-04-12 (×2): via INTRAVENOUS

## 2014-04-12 MED ORDER — VANCOMYCIN HCL IN DEXTROSE 1-5 GM/200ML-% IV SOLN
1000.0000 mg | INTRAVENOUS | Status: AC
  Administered 2014-04-12: 1000 mg via INTRAVENOUS

## 2014-04-12 MED ORDER — HYDROMORPHONE HCL 1 MG/ML IJ SOLN: 0.2500 mg | INTRAMUSCULAR | Status: DC | PRN

## 2014-04-12 MED ORDER — PROMETHAZINE HCL 25 MG/ML IJ SOLN: 6.2500 mg | INTRAMUSCULAR | Status: DC | PRN

## 2014-04-12 MED ORDER — MIDAZOLAM HCL 2 MG/2ML IJ SOLN
1.0000 mg | INTRAMUSCULAR | Status: DC | PRN
  Administered 2014-04-12: 0.5 mg via INTRAVENOUS

## 2014-04-12 MED ORDER — PROPOFOL 10 MG/ML IV BOLUS
INTRAVENOUS | Status: DC | PRN
  Administered 2014-04-12: 200 mg via INTRAVENOUS

## 2014-04-12 MED ORDER — FENTANYL CITRATE 0.05 MG/ML IJ SOLN
INTRAMUSCULAR | Status: AC
  Filled 2014-04-12: qty 2

## 2014-04-12 MED ORDER — VANCOMYCIN HCL IN DEXTROSE 1-5 GM/200ML-% IV SOLN
INTRAVENOUS | Status: AC
  Filled 2014-04-12: qty 200

## 2014-04-12 MED ORDER — BUPIVACAINE-EPINEPHRINE (PF) 0.5% -1:200000 IJ SOLN
INTRAMUSCULAR | Status: DC | PRN
  Administered 2014-04-12: 30 mL via PERINEURAL

## 2014-04-12 MED ORDER — MIDAZOLAM HCL 2 MG/2ML IJ SOLN
INTRAMUSCULAR | Status: AC
Start: 2014-04-12 — End: 2014-04-12
  Filled 2014-04-12: qty 2

## 2014-04-12 MED ORDER — ONDANSETRON HCL 4 MG/2ML IJ SOLN
INTRAMUSCULAR | Status: DC | PRN
  Administered 2014-04-12: 4 mg via INTRAVENOUS

## 2014-04-12 MED ORDER — MIDAZOLAM HCL 2 MG/2ML IJ SOLN: 0.5000 mg | Freq: Once | INTRAMUSCULAR | Status: DC | PRN

## 2014-04-12 MED ORDER — MEPERIDINE HCL 25 MG/ML IJ SOLN: 6.2500 mg | INTRAMUSCULAR | Status: DC | PRN

## 2014-04-12 MED ORDER — LIDOCAINE HCL (CARDIAC) 20 MG/ML IV SOLN
INTRAVENOUS | Status: DC | PRN
  Administered 2014-04-12: 20 mg via INTRAVENOUS

## 2014-04-12 MED ORDER — CHLORHEXIDINE GLUCONATE 4 % EX LIQD: 60.0000 mL | Freq: Once | CUTANEOUS | Status: DC

## 2014-04-12 MED ORDER — OXYCODONE-ACETAMINOPHEN 5-325 MG PO TABS: 1.0000 | ORAL_TABLET | ORAL | Status: DC | PRN

## 2014-04-12 SURGICAL SUPPLY — 72 items
APL SKNCLS STERI-STRIP NONHPOA (GAUZE/BANDAGES/DRESSINGS) ×1
BAG DECANTER FOR FLEXI CONT (MISCELLANEOUS) IMPLANT
BANDAGE ELASTIC 3 VELCRO ST LF (GAUZE/BANDAGES/DRESSINGS) IMPLANT
BANDAGE ELASTIC 4 VELCRO ST LF (GAUZE/BANDAGES/DRESSINGS) ×3 IMPLANT
BENZOIN TINCTURE PRP APPL 2/3 (GAUZE/BANDAGES/DRESSINGS) ×3 IMPLANT
BIT DRILL 2 FAST STEP (BIT) ×3 IMPLANT
BIT DRILL 2.5X4 QC (BIT) ×3 IMPLANT
BLADE MINI RND TIP GREEN BEAV (BLADE) IMPLANT
BLADE SURG 15 STRL LF DISP TIS (BLADE) ×2 IMPLANT
BLADE SURG 15 STRL SS (BLADE) ×6
BNDG CMPR 9X4 STRL LF SNTH (GAUZE/BANDAGES/DRESSINGS)
BNDG ESMARK 4X9 LF (GAUZE/BANDAGES/DRESSINGS) IMPLANT
BNDG GAUZE ELAST 4 BULKY (GAUZE/BANDAGES/DRESSINGS) ×3 IMPLANT
CANISTER SUCT 1200ML W/VALVE (MISCELLANEOUS) IMPLANT
CLOSURE WOUND 1/2 X4 (GAUZE/BANDAGES/DRESSINGS) ×1
CORDS BIPOLAR (ELECTRODE) ×3 IMPLANT
COVER BACK TABLE 60X90IN (DRAPES) ×3 IMPLANT
CUFF TOURNIQUET SINGLE 18IN (TOURNIQUET CUFF) IMPLANT
DECANTER SPIKE VIAL GLASS SM (MISCELLANEOUS) IMPLANT
DRAPE EXTREMITY T 121X128X90 (DRAPE) ×3 IMPLANT
DRAPE OEC MINIVIEW 54X84 (DRAPES) ×3 IMPLANT
DRAPE SURG 17X23 STRL (DRAPES) ×3 IMPLANT
DURAPREP 26ML APPLICATOR (WOUND CARE) ×3 IMPLANT
ELECT REM PT RETURN 9FT ADLT (ELECTROSURGICAL)
ELECTRODE REM PT RTRN 9FT ADLT (ELECTROSURGICAL) IMPLANT
GAUZE SPONGE 4X4 12PLY STRL (GAUZE/BANDAGES/DRESSINGS) ×3 IMPLANT
GAUZE SPONGE 4X4 16PLY XRAY LF (GAUZE/BANDAGES/DRESSINGS) IMPLANT
GAUZE XEROFORM 1X8 LF (GAUZE/BANDAGES/DRESSINGS) IMPLANT
GLOVE SURG SYN 8.0 (GLOVE) ×6 IMPLANT
GOWN STRL REUS W/ TWL LRG LVL3 (GOWN DISPOSABLE) ×1 IMPLANT
GOWN STRL REUS W/TWL LRG LVL3 (GOWN DISPOSABLE) ×3
GOWN STRL REUS W/TWL XL LVL3 (GOWN DISPOSABLE) ×3 IMPLANT
NEEDLE HYPO 25X1 1.5 SAFETY (NEEDLE) ×3 IMPLANT
NS IRRIG 1000ML POUR BTL (IV SOLUTION) ×3 IMPLANT
PACK BASIN DAY SURGERY FS (CUSTOM PROCEDURE TRAY) ×3 IMPLANT
PAD CAST 3X4 CTTN HI CHSV (CAST SUPPLIES) ×1 IMPLANT
PAD CAST 4YDX4 CTTN HI CHSV (CAST SUPPLIES) IMPLANT
PADDING CAST ABS 4INX4YD NS (CAST SUPPLIES) ×2
PADDING CAST ABS COTTON 4X4 ST (CAST SUPPLIES) ×1 IMPLANT
PADDING CAST COTTON 3X4 STRL (CAST SUPPLIES) ×3
PADDING CAST COTTON 4X4 STRL (CAST SUPPLIES)
PEG SUBCHONDRAL SMOOTH 2.0X24 (Peg) ×6 IMPLANT
PEG SUBCHONDRAL SMOOTH 2.0X26 (Peg) ×18 IMPLANT
PENCIL BUTTON HOLSTER BLD 10FT (ELECTRODE) IMPLANT
PLATE STAN 24.4X59.5 LT (Plate) ×3 IMPLANT
SCREW BN 12X3.5XNS CORT TI (Screw) ×1 IMPLANT
SCREW CORT 3.5X12 (Screw) ×3 IMPLANT
SCREW CORT 3.5X14 LNG (Screw) ×6 IMPLANT
SCREW MULTI DIRECT 24MM (Screw) ×3 IMPLANT
SHEET MEDIUM DRAPE 40X70 STRL (DRAPES) ×3 IMPLANT
SPLINT PLASTER CAST XFAST 3X15 (CAST SUPPLIES) IMPLANT
SPLINT PLASTER CAST XFAST 4X15 (CAST SUPPLIES) ×5 IMPLANT
SPLINT PLASTER XTRA FAST SET 4 (CAST SUPPLIES) ×10
SPLINT PLASTER XTRA FASTSET 3X (CAST SUPPLIES)
STOCKINETTE 4X48 STRL (DRAPES) ×3 IMPLANT
STRIP CLOSURE SKIN 1/2X4 (GAUZE/BANDAGES/DRESSINGS) ×2 IMPLANT
SUCTION FRAZIER TIP 10 FR DISP (SUCTIONS) IMPLANT
SUT ETHILON 4 0 PS 2 18 (SUTURE) IMPLANT
SUT MERSILENE 4 0 P 3 (SUTURE) IMPLANT
SUT PROLENE 3 0 PS 2 (SUTURE) ×3 IMPLANT
SUT SILK 2 0 FS (SUTURE) IMPLANT
SUT VIC AB 0 SH 27 (SUTURE) ×3 IMPLANT
SUT VIC AB 3-0 FS2 27 (SUTURE) IMPLANT
SUT VIC AB 4-0 RB1 18 (SUTURE) ×3 IMPLANT
SUT VICRYL RAPIDE 4-0 (SUTURE) IMPLANT
SUT VICRYL RAPIDE 4/0 PS 2 (SUTURE) IMPLANT
SYR BULB 3OZ (MISCELLANEOUS) ×3 IMPLANT
SYRINGE 10CC LL (SYRINGE) ×3 IMPLANT
TOWEL OR 17X24 6PK STRL BLUE (TOWEL DISPOSABLE) ×3 IMPLANT
TUBE CONNECTING 20'X1/4 (TUBING)
TUBE CONNECTING 20X1/4 (TUBING) IMPLANT
UNDERPAD 30X30 INCONTINENT (UNDERPADS AND DIAPERS) ×3 IMPLANT

## 2014-04-12 NOTE — Op Note (Signed)
See note [id number]

## 2014-04-12 NOTE — Anesthesia Preprocedure Evaluation (Addendum)
Anesthesia Evaluation  Patient identified by MRN, date of birth, ID band Patient awake    Reviewed: Allergy & Precautions, NPO status , Patient's Chart, lab work & pertinent test results, reviewed documented beta blocker date and time   History of Anesthesia Complications Negative for: history of anesthetic complications  Airway Mallampati: II  TM Distance: >3 FB Neck ROM: Full    Dental  (+) Teeth Intact, Dental Advisory Given   Pulmonary neg pulmonary ROS,  breath sounds clear to auscultation        Cardiovascular Rhythm:Regular Rate:Normal  '13 cath: smooth and normal coronary arteries, normal left ventricular systolic function.   Neuro/Psych Early alzheimer'snegative neurological ROS     GI/Hepatic negative GI ROS, Neg liver ROS,   Endo/Other  negative endocrine ROS  Renal/GU negative Renal ROS     Musculoskeletal   Abdominal   Peds  Hematology negative hematology ROS (+)   Anesthesia Other Findings   Reproductive/Obstetrics                           Anesthesia Physical Anesthesia Plan  ASA: III  Anesthesia Plan: General   Post-op Pain Management:    Induction: Intravenous  Airway Management Planned: LMA  Additional Equipment:   Intra-op Plan:   Post-operative Plan:   Informed Consent: I have reviewed the patients History and Physical, chart, labs and discussed the procedure including the risks, benefits and alternatives for the proposed anesthesia with the patient or authorized representative who has indicated his/her understanding and acceptance.   Dental advisory given  Plan Discussed with: CRNA and Surgeon  Anesthesia Plan Comments: (Plan routine monitors, GA-LMA OK, axillary block for post op analgesia)        Anesthesia Quick Evaluation

## 2014-04-12 NOTE — Progress Notes (Signed)
Assisted Dr. Glennon Mac with left, axillary block. Side rails up, monitors on throughout procedure. See vital signs in flow sheet. Tolerated Procedure well.

## 2014-04-12 NOTE — Anesthesia Postprocedure Evaluation (Signed)
Anesthesia Post Note  Patient: Brian [name] [facility]  Procedure(s) Performed: Procedure(s) (LRB): OPEN REDUCTION INTERNAL FIXATION (ORIF) LEFT DISTAL RADIAL FRACTURE (Left) LEFT CARPAL TUNNEL RELEASE (Left)  Anesthesia type: General  Patient location: PACU  Post pain: Pain level controlled  Post assessment: Post-op Vital signs reviewed  Last Vitals: BP 153/96 mmHg  Pulse 71  Temp(Src) 36.4 C (Oral)  Resp 15  Ht 6\' 1"  (1.854 m)  Wt 185 lb (83.915 kg)  BMI 24.41 kg/m2  SpO2 95%  Post vital signs: Reviewed  Level of consciousness: sedated  Complications: No apparent anesthesia complications

## 2014-04-12 NOTE — Anesthesia Procedure Notes (Addendum)
Anesthesia Regional Block:  Axillary brachial plexus block  Pre-Anesthetic Checklist: ,, timeout performed, Correct Patient, Correct Site, Correct Laterality, Correct Procedure, Correct Position, site marked, Risks and benefits discussed,  Surgical consent,  Pre-op evaluation,  At surgeon's request and post-op pain management  Laterality: Left and Upper  Prep: chloraprep       Needles:  Injection technique: Single-shot  Needle Type: Other   (short "B" bevel)    Needle Gauge: 22 and 22 G    Additional Needles:  Procedures: paresthesia technique Axillary brachial plexus block  Nerve Stimulator or Paresthesia:  Response: median nerve,  Response: ulnar nerve,   Additional Responses:   Narrative:  Start time: 04/12/2014 12:40 PM End time: 04/12/2014 12:46 PM Injection made incrementally with aspirations every 5 mL.  Performed by: Personally  Anesthesiologist: Seleta Rhymes CARSWELL  Additional Notes: Pt identified in Holding room.  Monitors applied. Working IV access confirmed. Sterile prep L axilla.  #22ga short "B" bevel to transient median, then ulnar nerve paresthesias. Total 25cc 1.5% lidocaine with 1:200k epi and 30cc 0.5% Bupivacaine with 1:200k epi injected incrementally after negative test dose, distributed around each paresthesia.  Patient asymptomatic, VSS, no heme aspirated, tolerated well.  Jenita Seashore, MD   Procedure Name: LMA Insertion Date/Time: 04/12/2014 1:32 PM Performed by: Colton Tassin Pre-anesthesia Checklist: Patient identified, Emergency Drugs available, Suction available and Patient being monitored Patient Re-evaluated:Patient Re-evaluated prior to inductionOxygen Delivery Method: Circle System Utilized Preoxygenation: Pre-oxygenation with 100% oxygen Intubation Type: IV induction Ventilation: Mask ventilation without difficulty LMA: LMA inserted LMA Size: 5.0 Number of attempts: 1 Airway Equipment and Method: Bite block Placement Confirmation:  positive ETCO2 Tube secured with: Tape Dental Injury: Teeth and Oropharynx as per pre-operative assessment

## 2014-04-12 NOTE — H&P (Signed)
Brian [name] is an 64 y.o. male.   Chief Complaint: left wrist pain and median numbess s/p fall HPI: as above s/p fall with displaced left distal radius fracture  Past Medical History  Diagnosis Date  . Reflux   . Sexual dysfunction     Corrected  . Melanotic neuroectodermal tumor   . Alzheimer disease   . Wears hearing aid     Past Surgical History  Procedure Laterality Date  . Tonsillectomy      October of 1969  . Neuroma surgery      Disc Repair  . Cardiovascular stress test  12-04-2004    EF 68%  . Lumbar disc surgery    . Tonsillectomy    . Colonoscopy    . Cardiac catheterization  2014    History reviewed. No pertinent family history. Social History:  reports that he has never smoked. He does not have any smokeless tobacco history on file. He reports that he drinks alcohol. He reports that he does not use illicit drugs.  Allergies:  Allergies  Allergen Reactions  . Amoxicillin     Bothers Stomach    No prescriptions prior to admission    No results found for this or any previous visit (from the past 19 hour(s)). No results found.  Review of Systems  All other systems reviewed and are negative.   Height 6' 1.25" (1.861 m), weight 87.091 kg (192 lb). Physical Exam  Constitutional: He is oriented to person, place, and time. He appears well-developed and well-nourished.  HENT:  Head: Normocephalic and atraumatic.  Cardiovascular: Normal rate.   Respiratory: Effort normal.  Musculoskeletal:       Left wrist: He exhibits tenderness, bony tenderness, swelling and deformity.  Displaced left distal radius fracture with CTS  Neurological: He is alert and oriented to person, place, and time.  Skin: Skin is warm.  Psychiatric: He has a normal mood and affect. His behavior is normal. Judgment and thought content normal.     Assessment/Plan As above  Plan ORIF with CTR Elara Cocke A 04/12/2014, 9:01 AM

## 2014-04-12 NOTE — Transfer of Care (Signed)
Immediate Anesthesia Transfer of Care Note  Patient: Brian [name] [facility]  Procedure(s) Performed: Procedure(s) with comments: OPEN REDUCTION INTERNAL FIXATION (ORIF) LEFT DISTAL RADIAL FRACTURE (Left) - ANESTHESIA: GENERAL/AXILLARY BLOCK LEFT CARPAL TUNNEL RELEASE (Left)  Patient Location: PACU  Anesthesia Type:GA combined with regional for post-op pain  Level of Consciousness: awake, alert , oriented and patient cooperative  Airway & Oxygen Therapy: Patient Spontanous Breathing and Patient connected to face mask oxygen  Post-op Assessment: Report given to PACU RN and Post -op Vital signs reviewed and stable  Post vital signs: Reviewed and stable  Complications: No apparent anesthesia complications

## 2014-04-12 NOTE — Discharge Instructions (Signed)
°  Post Anesthesia Home Care Instructions ° °Activity: °Get plenty of rest for the remainder of the day. A responsible adult should stay with you for 24 hours following the procedure.  °For the next 24 hours, DO NOT: °-Drive a car °-Operate machinery °-Drink alcoholic beverages °-Take any medication unless instructed by your physician °-Make any legal decisions or sign important papers. ° °Meals: °Start with liquid foods such as gelatin or soup. Progress to regular foods as tolerated. Avoid greasy, spicy, heavy foods. If nausea and/or vomiting occur, drink only clear liquids until the nausea and/or vomiting subsides. Call your physician if vomiting continues. ° °Special Instructions/Symptoms: °Your throat may feel dry or sore from the anesthesia or the breathing tube placed in your throat during surgery. If this causes discomfort, gargle with warm salt water. The discomfort should disappear within 24 hours. ° °Regional Anesthesia Blocks ° °1. Numbness or the inability to move the "blocked" extremity may last from 3-48 hours after placement. The length of time depends on the medication injected and your individual response to the medication. If the numbness is not going away after 48 hours, call your surgeon. ° °2. The extremity that is blocked will need to be protected until the numbness is gone and the  Strength has returned. Because you cannot feel it, you will need to take extra care to avoid injury. Because it may be weak, you may have difficulty moving it or using it. You may not know what position it is in without looking at it while the block is in effect. ° °3. For blocks in the legs and feet, returning to weight bearing and walking needs to be done carefully. You will need to wait until the numbness is entirely gone and the strength has returned. You should be able to move your leg and foot normally before you try and bear weight or walk. You will need someone to be with you when you first try to ensure you  do not fall and possibly risk injury. ° °4. Bruising and tenderness at the needle site are common side effects and will resolve in a few days. ° °5. Persistent numbness or new problems with movement should be communicated to the surgeon or the [facility] ([phone])/  Surgery Center ([phone]). °

## 2014-04-13 ENCOUNTER — Encounter (HOSPITAL_BASED_OUTPATIENT_CLINIC_OR_DEPARTMENT_OTHER): Payer: Self-pay | Admitting: Orthopedic Surgery

## 2014-04-13 NOTE — Op Note (Signed)
NAME:  Brian [name], Brian [name]                ACCOUNT NO.:  [id number]  MEDICAL RECORD NO.:  [id number]  LOCATION:                               FACILITY:  [facility]  PHYSICIAN:  Alle Difabio A. Myranda Pavone, M.D.DATE OF BIRTH:  08/26/1950  DATE OF PROCEDURE:  04/12/2014 DATE OF DISCHARGE:  04/12/2014                              OPERATIVE REPORT   PREOPERATIVE DIAGNOSIS:  Displaced intra-articular fracture, 4-part, left side.  POSTOPERATIVE DIAGNOSIS:  Displaced intra-articular fracture, 4-part, left side.  PROCEDURE:  Open reduction and internal fixation above with DVR plate and screws and carpal tunnel release, step incision.  SURGEON:  Braedon Sjogren A. Vannia Pola, MD  ASSISTANT:  Robert J. Dasnoit, PA  ANESTHESIA:  Axillary block and general.  COMPLICATION:  No complication.  DRAINS:  No drains.  PROCEDURE IN DETAIL:  Patient was taken to the operating suite.  After induction of adequate general anesthetic and a supraclavicular block, the left upper extremity was prepped and draped in usual sterile fashion.  An Esmarch was used to exsanguinate the limb.  Tourniquet was inflated 250 mmHg.  At this point in time, incision was made over the palpable border of the flexor carpi radialis tendon.  Skin was incised longitudinally 5-6 cm sheath overlying the FCR was incised.  The FCR was retracted in the midline, the radial artery to lateral side.  We split the fascia and dissected down the pronator quadratus.  We subperiosteally stripped the pronator quadratus off the fracture revealing a 4-part displaced intra-articular fracture of the distal radius, left side.  We carefully released brachioradialis off the radial styloid fragment to aid in reduction.  Reduction was performed in longitudinal traction and flexion, ulnar deviation.  Standard DVR plate was placed on the lower aspect of distal radius, fixed to the slotted hole loosely and then we used fluoroscopy to determine adequate position.  Two more  cortical screws were placed proximally followed by smooth pegs distally.  Intraoperative fluoroscopy then revealed possible intra-articular placement of the radial styloid, pinned, it was removed and a multidirectional screw was placed under direct and fluoroscopic guidance outside the joint.  This was confirmed fluoroscopically.  The wound was then thoroughly irrigated.  The median nerve was identified, protected with a Freer elevator and traced in the proximal aspect of the carpal tunnel.  Transverse carpal wound was divided.  We then made a counterincision in the palm and divided the ligament from distal to proximal until the two met, just decompressing the nerve.  The wound was thoroughly irrigated and loosely closed in layers of 2-0 undyed Vicryl to cover the plate with the pronator quadratus, 4-0 Vicryl subcutaneously, and 3-0 Prolene subcuticular stitch to close both the carpal tunnel and the operative fixation incisions.  Patient was then placed in sterile dressing and Steri-Strips, 4x4s, fluffs, and a volar splint.  The patient tolerated all procedures well in a concealed fashion.     Brian [name], M.D.     MAW/MEDQ  D:  04/12/2014  T:  04/13/2014  Job:  [id number] 

## 2014-04-13 NOTE — Op Note (Deleted)
NAME:  [name], Brian                ACCOUNT NO.:  [id number]  MEDICAL RECORD NO.:  [id number]  LOCATION:                               FACILITY:  [facility]  PHYSICIAN:   A. , M.D.DATE OF BIRTH:  12/12/1950  DATE OF PROCEDURE:  04/12/2014 DATE OF DISCHARGE:  04/12/2014                              OPERATIVE REPORT   PREOPERATIVE DIAGNOSIS:  Displaced intra-articular fracture, 4-part, left side.  POSTOPERATIVE DIAGNOSIS:  Displaced intra-articular fracture, 4-part, left side.  PROCEDURE:  Open reduction and internal fixation above with DVR plate and screws and carpal tunnel release, step incision.  SURGEON:   A. , MD  ASSISTANT:  Robert J. Dasnoit, PA  ANESTHESIA:  Axillary block and general.  COMPLICATION:  No complication.  DRAINS:  No drains.  PROCEDURE IN DETAIL:  Patient was taken to the operating suite.  After induction of adequate general anesthetic and a supraclavicular block, the left upper extremity was prepped and draped in usual sterile fashion.  An Esmarch was used to exsanguinate the limb.  Tourniquet was inflated 250 mmHg.  At this point in time, incision was made over the palpable border of the flexor carpi radialis tendon.  Skin was incised longitudinally 5-6 cm sheath overlying the FCR was incised.  The FCR was retracted in the midline, the radial artery to lateral side.  We split the fascia and dissected down the pronator quadratus.  We subperiosteally stripped the pronator quadratus off the fracture revealing a 4-part displaced intra-articular fracture of the distal radius, left side.  We carefully released brachioradialis off the radial styloid fragment to aid in reduction.  Reduction was performed in longitudinal traction and flexion, ulnar deviation.  Standard DVR plate was placed on the lower aspect of distal radius, fixed to the slotted hole loosely and then we used fluoroscopy to determine adequate position.  Two more  cortical screws were placed proximally followed by smooth pegs distally.  Intraoperative fluoroscopy then revealed possible intra-articular placement of the radial styloid, pinned, it was removed and a multidirectional screw was placed under direct and fluoroscopic guidance outside the joint.  This was confirmed fluoroscopically.  The wound was then thoroughly irrigated.  The median nerve was identified, protected with a Freer elevator and traced in the proximal aspect of the carpal tunnel.  Transverse carpal wound was divided.  We then made a counterincision in the palm and divided the ligament from distal to proximal until the two met, just decompressing the nerve.  The wound was thoroughly irrigated and loosely closed in layers of 2-0 undyed Vicryl to cover the plate with the pronator quadratus, 4-0 Vicryl subcutaneously, and 3-0 Prolene subcuticular stitch to close both the carpal tunnel and the operative fixation incisions.  Patient was then placed in sterile dressing and Steri-Strips, 4x4s, fluffs, and a volar splint.  The patient tolerated all procedures well in a concealed fashion.      A. , M.D.     MAW/MEDQ  D:  04/12/2014  T:  04/13/2014  Job:  [id number] 

## 2014-05-15 ENCOUNTER — Encounter: Payer: Self-pay | Admitting: Neurology

## 2014-05-15 ENCOUNTER — Ambulatory Visit (INDEPENDENT_AMBULATORY_CARE_PROVIDER_SITE_OTHER): Payer: 59 | Admitting: Neurology

## 2014-05-15 VITALS — BP 110/74 | HR 67 | Ht 73.0 in | Wt 182.6 lb

## 2014-05-15 DIAGNOSIS — F0390 Unspecified dementia without behavioral disturbance: Secondary | ICD-10-CM

## 2014-05-15 NOTE — Progress Notes (Signed)
NEUROLOGY FOLLOW UP OFFICE NOTE  Brian [name] [id number]  HISTORY OF PRESENT ILLNESS: I had the pleasure of seeing Brian [name] in follow-up in the neurology clinic on 05/15/2014.  He is again accompanied by his wife who supplements the history. The patient was last seen 3 months ago for worsening memory loss, some visual extinction on the left hemifield. Neuropsychological evaluation consistent with a cortical dementia. He started Aricept 10mg  and is tolerating this without difficulties. He reports good and bad days. His wife reports symptoms are unchanged, he continues to ask the same questions repeatedly. He has difficulties retaining and processing information, for instance, after watching a movie recently where he had to read something on screen, he did not comprehend what was said because it was "going too fast." He has similar difficulties with conversations. He continues to have visuo-spatial difficulties, in church he cannot follow the hymnal and his wife would have to point out the page. He would read the newspaper but cannot seem to process the information. He would like to volunteer at work but does not feel comfortable handling cash. His wife handles all their transactions when they go out. He has limited his driving to going to the store down the road. He denies getting lost or turned around.   He denies any headaches, dizziness, diplopia, dysarthria/dysphagia, neck/back pain, focal numbness/tingling, bowel/bladder dysfunction. He has some diarrhea when first starting Aricept, which has resolved. He has occasional insomnia and would take his wife's Trazodone at times. He slipped on ice last January and needed surgery on his left wrist, currently in a cast. He reports exercising by walking the dog twice a day.   HPI: This is a very pleasant 64 yo RH man with a history of back surgery, melanoma,with memory loss since the beginning of 2015. The patient reports that he has to "dig in my  head to pull something out." He is usually good with names but started forgetting them. He has not gotten lost driving, but would get turned around, asking his wife how to get to a certain place. 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 He did not pass a qualification test for weapons because of depth perception. He saw his ophthalmologist and underwent cataract surgery in November 2014. 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 He had difficulties following instructions, and felt that the instructor was going so fast. He had a hard time filling out the bubbles on a multiple choice questionnaire. He got hired to work for [employer] last July, but came home one day telling his wife he had problems with his reports, that his supervisor was losing patience with him because he can't seem to do things. His wife has also noticed that he would ask the same questions repeatedly. He would say he fed their pets, but actually did not. He would forget to do chores at home such as bringing out the trash. His wife has been in charge of bills for the past 1-1/2 years as a Lexicographer, they denied any missed bill payments. He states he has always had word-finding difficulties.No history of head injuries or falls. There is no family history of memory problems.  Diagnostic Data: I  personally reviewed MRI brain with and without contrast which showed patchy and confluent cerebral white matter T2 and FLAIR hyperintensities bilaterally, periatrial white matter is most affected, more over the posterior hemispheres. There are multiple small chronic micro hemorrhages identified on series 12 with susceptibility weighted imaging. Mostly these are in the posterior  hemispheres. They demonstrate no associated intrinsic T1 hyperintensity or postcontrast enhancement. No abnormal enhancement identified. Mesial temporal lobe structures are symmetric and within normal limits for age.   Neuropsychological testing indicated serious impairments in multiple domains. He had prominent impairment of visual-perceptual processing, left-sided visual suppressions to double simultaneous stimulation though no signs of spatial inattention, visual and auditory memory impairment, deficits in verbal fluency, and executive dysfunction. There was no evidence of affective distress. Testing strongly indicated cerebral dysfunction suggestive of a neurodegenerative process, most consistent with a cortical dementia. Recommendations included assistance with more complex daily activities, not to drive until he undergoes a formal on-the-road driving evaluation, monitor mood. It was noted that he is clearly disabled from any type of competitive employment and will be for the forseeable future.   Laboratory Data: 10/2013 B12 448, 06/2013 TSH 1.79, CBC and CMP normal.  PAST MEDICAL HISTORY: Past Medical History  Diagnosis Date  . Reflux   . Sexual dysfunction     Corrected  . Melanotic neuroectodermal tumor   . Alzheimer disease   . Wears hearing aid     MEDICATIONS: Current Outpatient Prescriptions on File Prior to Visit  Medication Sig Dispense Refill  . aspirin 325 MG tablet Take 325 mg by mouth daily.    . Cyanocobalamin (VITAMIN B-12 PO) Take 5,000 mcg by mouth daily.     Marland Kitchen DEXILANT 60 MG capsule Take 1 capsule by mouth daily.  5  . donepezil (ARICEPT) 10 MG tablet Take 1/2 tablet daily for 1 week, then increase to 1 tablet daily 30 tablet 6  . Loratadine (ALAVERT PO) Take by mouth daily.    Marland Kitchen oxyCODONE-acetaminophen (ROXICET) 5-325 MG per tablet Take 1 tablet by mouth every 4 (four) hours as needed for severe pain. 30 tablet 0  . VIAGRA 100 MG tablet Take 100 mg by mouth as  needed for erectile dysfunction.      No current facility-administered medications on file prior to visit.    ALLERGIES: Allergies  Allergen Reactions  . Amoxicillin     Bothers Stomach    FAMILY HISTORY: History reviewed. No pertinent family history.  SOCIAL HISTORY: History   Social History  . Marital Status: Married    Spouse Name: N/A  . Number of Children: N/A  . Years of Education: N/A   Occupational History  . Not on file.   Social History Main Topics  . Smoking status: Never Smoker   . Smokeless tobacco: Not on file  . Alcohol Use: 0.0 oz/week    0 Standard drinks or equivalent per week     Comment: 3 Beers per Day  . Drug Use: No  . Sexual Activity: Not on file   Other Topics Concern  . Not on file   Social History Narrative    REVIEW OF SYSTEMS: Constitutional: No fevers, chills, or sweats, no generalized fatigue, change in appetite Eyes: No visual changes, double vision, eye pain Ear, nose and throat: No hearing loss, ear pain, nasal congestion, sore throat Cardiovascular: No chest pain, palpitations Respiratory:  No shortness of breath at rest or with exertion, wheezes GastrointestinaI: No nausea, vomiting, diarrhea, abdominal pain, fecal incontinence Genitourinary:  No  dysuria, urinary retention or frequency Musculoskeletal:  No neck pain, back pain Integumentary: No rash, pruritus, skin lesions Neurological: as above Psychiatric: No depression, insomnia, anxiety Endocrine: No palpitations, fatigue, diaphoresis, mood swings, change in appetite, change in weight, increased thirst Hematologic/Lymphatic:  No anemia, purpura, petechiae. Allergic/Immunologic: no itchy/runny eyes, nasal congestion, recent allergic reactions, rashes  PHYSICAL EXAM: Filed Vitals:   05/15/14 0832  BP: 110/74  Pulse: 67   General: No acute distress Head:  Normocephalic/atraumatic Neck: supple, no paraspinal tenderness, full range of motion Heart:  Regular rate and  rhythm Lungs:  Clear to auscultation bilaterally Back: No paraspinal tenderness Skin/Extremities: No rash, no edema; left wrist in cast Neurological Exam: alert and oriented to person, place, and time. No aphasia or dysarthria. Fund of knowledge is appropriate. Remote memory intact. 1/3 delayed recall (similar to prior). Attention and concentration are reduced, difficulty spelling WORLD backward and serial 7s. Able to name objects and repeat phrases. Cranial nerves: pupils equal, round and reactive to light, visual fields intact on individual gross field testing, however appears to have visual to double simultaneous stimulation on the left hemifield (similar to prior, not consistent but noted again today), fundi unremarkable.CN III, IV, VI: full range of motion, no nystagmus, no ptosis CN V: facial sensation intact CN VII: upper and lower face symmetric CN VIII: hearing intact to voice. CN IX, X: gag intact, uvula midline CN XI: sternocleidomastoid and trapezius muscles intact CN XII: tongue midline Bulk & Tone: normal, no cogwheeling, no fasciculations. Motor: 5/5 throughout with no pronator drift. Sensation: intact to light touch. No extinction to double simultaneous stimulation. Romberg test negative Deep Tendon Reflexes: +1 throughout, absent ankle jerks bilaterally, no ankle clonus Plantar responses: downgoing bilaterally Cerebellar: no incoordination on finger to nose Gait: narrow-based and steady, able to tandem walk adequately. Tremor: none  IMPRESSION: This is a very pleasant 64 yo RH man with a history of back surgery, melanoma, who presented with worsening memory loss. His neurological exam is non-focal except for possible visual extinction to double simultaneous stimulation on the left hemifield, noted on prior visits. MMSE today is 23/30. He was also noted to have similar left-sided visual suppressions to double simultaneous stimulation during Neuropsych evaluation. MRI brain did not show  any acute changes, there were white matter changes seen more over the posterior head regions, which can cause visual spatial perception difficulties, suggestive more of a subcortical dementia, although his Neuropsych evaluation reported results most consistent with a cortical dementia. We again discussed the diagnosis of early dementia, and continuation of Aricept 10mg /day. We again discussed the importance of controlling vascular risk factors, physical and brain stimulation exercises for brain health. We discussed that with his cognitive and visuo-spatial difficulties, I agree with Neuropsychological evaluation that he is disabled from any type of competitive employment and will be for the forseeable future. Driving evaluation was recommended, he now does limited driving, no concerns from his wife. He will follow-up in 6 months and knows to call our office for any changes.  Thank you for allowing me to participate in his care.  Please do not hesitate to call for any questions or concerns.  The duration of this appointment visit was 25 minutes of face-to-face time with the patient.  Greater than 50% of this time was spent in counseling, explanation of diagnosis, planning of further management, and coordination of care.   Ellouise Newer, M.D.   CC: Dr. Moreen Fowler

## 2014-05-15 NOTE — Patient Instructions (Signed)
1. Continue Aricept 10mg  daily 2. Physical exercise and brain stimulation exercises are important for brain health 3. Monitor driving, recommend driving evaluation  4. Follow-up in 6 months

## 2014-06-27 ENCOUNTER — Telehealth: Payer: Self-pay | Admitting: Neurology

## 2014-06-27 NOTE — Telephone Encounter (Signed)
Try reducing to 1/2 tablet and see if this helps relieve some of his symptoms. Thanks

## 2014-06-27 NOTE — Telephone Encounter (Signed)
Patient states that he's been losing weight(13 lbs) and was also having issues with frequent stools(diarrhea). States that this started tapering off last week. He also c/o some cramping in ankles & legs. He doesn't know if this could be coming from the Aricept. He does state that it has worked well for him & he's noticed a difference while being on medication.

## 2014-06-27 NOTE — Telephone Encounter (Signed)
Lmom to return my call. 

## 2014-06-27 NOTE — Telephone Encounter (Signed)
Brian [name], pt's wife called wanting to speak to a nurse regarding some possible side effects from  DONEPEZIL 10mg . Pt has lost weight since taking the script and has been experiencing some cramping as well. C/b [phone]

## 2014-06-28 NOTE — Telephone Encounter (Signed)
Called patient again and notified of advisement. Patient stated that he would try the 1/2 tablet daily.

## 2014-08-02 ENCOUNTER — Telehealth: Payer: Self-pay | Admitting: Neurology

## 2014-08-02 NOTE — Telephone Encounter (Signed)
Returned call. She wanted to know the status of [employer] receiving patient's records for his disability claim. I did look in patient's chart & there wasn't a release scanned in form [employer]. I did give her the number to medical records to see if she could get some further information as she states that we should've gotten the release sometime in February. She will give medical records a call.

## 2014-08-02 NOTE — Telephone Encounter (Signed)
Pt wife cheryl states that she called a couple of weeks ago about getting a letter done from Dr Delice Lesch and still has not heard anything please call her back at [phone]

## 2014-08-30 ENCOUNTER — Other Ambulatory Visit: Payer: Self-pay | Admitting: Neurology

## 2014-11-13 ENCOUNTER — Ambulatory Visit (INDEPENDENT_AMBULATORY_CARE_PROVIDER_SITE_OTHER): Payer: Commercial Managed Care - HMO | Admitting: Neurology

## 2014-11-13 ENCOUNTER — Encounter: Payer: Self-pay | Admitting: Neurology

## 2014-11-13 VITALS — BP 118/70 | HR 68 | Resp 16 | Ht 73.0 in | Wt 183.0 lb

## 2014-11-13 DIAGNOSIS — F0390 Unspecified dementia without behavioral disturbance: Secondary | ICD-10-CM | POA: Diagnosis not present

## 2014-11-13 MED ORDER — DONEPEZIL HCL 10 MG PO TABS: 10.0000 mg | ORAL_TABLET | Freq: Every day | ORAL | Status: DC

## 2014-11-13 NOTE — Patient Instructions (Addendum)
1. Continue Aricept 10mg  daily 2. Control of BP, cholesterol, as well as physical exercise and brain stimulation exercises are important for brain health 3. Follow-up in 1 year, call for any changes

## 2014-11-13 NOTE — Progress Notes (Signed)
NEUROLOGY FOLLOW UP OFFICE NOTE  Brian [name] [id number]  HISTORY OF PRESENT ILLNESS: I had the pleasure of seeing Brian [name] in follow-up in the neurology clinic on 11/13/2014.  The patient was last seen 6 months ago for early onset cortical dementia and is accompanied by his wife who helps supplement the history today.  Since his last visit, he continues to have good and bad days. He has occasional difficulties figuring out things, for instance he always grinds his coffee beans in the morning, he was doing this one time and drew a blank on what he was doing. He fooled around with the machine for a bit, sat down for 20 minutes, then came back and was able to grind the beans without difficulty. He would be carrying on a conversation then get frustrated thinking of the word he wanted to say. He reports having more good days than bad. He drives short distances without getting lost. He has visuo-spatial difficulties but tells me he reads the paper everyday without problems. He is taking Aricept 10mg  daily without any difficulties.   He denies any headaches, dizziness, diplopia, dysarthria/dysphagia, neck/back pain, focal numbness/tingling, bowel/bladder dysfunction.   HPI 12/12/2013: This is a very pleasant 64 yo RH man with a history of back surgery, melanoma,with memory loss since the beginning of 2015. The patient reports that he has to "dig in my head to pull something out." He is usually good with names but started forgetting them. He has not gotten lost driving, but would get turned around, asking his wife how to get to a certain place. 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 He did not pass a qualification test for weapons because of depth perception. He saw his ophthalmologist and underwent cataract surgery in November  2014. 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 He had difficulties following instructions, and felt that the instructor was going so fast. He had a hard time filling out the bubbles on a multiple choice questionnaire. He got hired to work for [employer] last July, but came home one day telling his wife he had problems with his reports, that his supervisor was losing patience with him because he can't seem to do things. His wife has also noticed that he would ask the same questions repeatedly. He would say he fed their pets, but actually did not. He would forget to do chores at home such as bringing out the trash. His wife has been in charge of bills for the past 1-1/2 years as a Lexicographer, they denied any missed bill payments. He states he has always had word-finding difficulties.No history of head injuries or falls. There is no family history of memory problems.  Diagnostic Data: I personally reviewed MRI brain with and without contrast which showed patchy and confluent cerebral white matter T2 and FLAIR hyperintensities bilaterally, periatrial white matter is most affected, more over the posterior hemispheres. There are multiple small chronic micro hemorrhages identified on series 12 with susceptibility weighted imaging. Mostly these are in the posterior hemispheres. They demonstrate no associated intrinsic T1 hyperintensity or postcontrast enhancement. No abnormal enhancement identified. Mesial temporal lobe structures are symmetric and within normal limits for age.   Neuropsychological  testing indicated serious impairments in multiple domains. He had prominent impairment of visual-perceptual processing, left-sided visual suppressions to double simultaneous stimulation though no signs of spatial inattention, visual and auditory memory impairment, deficits in verbal fluency, and executive dysfunction. There  was no evidence of affective distress. Testing strongly indicated cerebral dysfunction suggestive of a neurodegenerative process, most consistent with a cortical dementia. Recommendations included assistance with more complex daily activities, not to drive until he undergoes a formal on-the-road driving evaluation, monitor mood. It was noted that he is clearly disabled from any type of competitive employment and will be for the forseeable future.   Laboratory Data: 10/2013 B12 448, 06/2013 TSH 1.79, CBC and CMP normal.  PAST MEDICAL HISTORY: Past Medical History  Diagnosis Date  . Reflux   . Sexual dysfunction     Corrected  . Melanotic neuroectodermal tumor   . Alzheimer disease   . Wears hearing aid     MEDICATIONS: Current Outpatient Prescriptions on File Prior to Visit  Medication Sig Dispense Refill  . aspirin 325 MG tablet Take 325 mg by mouth daily.    . Cyanocobalamin (VITAMIN B-12 PO) Take 5,000 mcg by mouth daily.     Marland Kitchen DEXILANT 60 MG capsule Take 1 capsule by mouth daily.  5  . Loratadine (ALAVERT PO) Take by mouth daily.    Marland Kitchen VIAGRA 100 MG tablet Take 100 mg by mouth as needed for erectile dysfunction.     Aricept 10mg  daily No current facility-administered medications on file prior to visit.    ALLERGIES: Allergies  Allergen Reactions  . Amoxicillin     Bothers Stomach    FAMILY HISTORY: No family history on file.  SOCIAL HISTORY: Social History   Social History  . Marital Status: Married    Spouse Name: N/A  . Number of Children: N/A  . Years of Education: N/A   Occupational History  . Not on file.   Social History Main Topics  . Smoking status: Never Smoker   . Smokeless tobacco: Not on file  . Alcohol Use: 0.0 oz/week    0 Standard drinks or equivalent per week     Comment: 3 Beers per Day  . Drug Use: No  . Sexual Activity: Not on file   Other Topics Concern  . Not on file   Social History Narrative    REVIEW OF  SYSTEMS: Constitutional: No fevers, chills, or sweats, no generalized fatigue, change in appetite Eyes: No visual changes, double vision, eye pain Ear, nose and throat: No hearing loss, ear pain, nasal congestion, sore throat Cardiovascular: No chest pain, palpitations Respiratory:  No shortness of breath at rest or with exertion, wheezes GastrointestinaI: No nausea, vomiting, diarrhea, abdominal pain, fecal incontinence Genitourinary:  No dysuria, urinary retention or frequency Musculoskeletal:  No neck pain, back pain Integumentary: No rash, pruritus, skin lesions Neurological: as above Psychiatric: No depression, insomnia, anxiety Endocrine: No palpitations, fatigue, diaphoresis, mood swings, change in appetite, change in weight, increased thirst Hematologic/Lymphatic:  No anemia, purpura, petechiae. Allergic/Immunologic: no itchy/runny eyes, nasal congestion, recent allergic reactions, rashes  PHYSICAL EXAM: Filed Vitals:   11/13/14 0829  BP: 118/70  Pulse: 68  Resp: 16   General: No acute distress Head:  Normocephalic/atraumatic Neck: supple, no paraspinal tenderness, full range of motion Heart:  Regular rate and rhythm Lungs:  Clear to auscultation bilaterally Back: No paraspinal tenderness Skin/Extremities: No rash, no edema Neurological Exam: alert and oriented to person, place, and time. No aphasia or dysarthria. Fund  of knowledge is appropriate.  Recent and remote memory are intact.  Attention and concentration are normal.    Able to name objects and repeat phrases.  MMSE - Mini Mental State Exam 11/13/2014 05/15/2014  Orientation to time 5 5  Orientation to Place 5 5  Registration 3 3  Attention/ Calculation 3 1  Recall 2 2  Language- name 2 objects 2 2  Language- repeat 1 1  Language- follow 3 step command 2 2  Language- read & follow direction 1 1  Write a sentence 1 1  Copy design 0 0  Total score 25 23   Cranial nerves: Pupils equal, round, reactive to light.   Fundoscopic exam unremarkable, no papilledema. Extraocular movements intact with no nystagmus. Visual fields full. Facial sensation intact. No facial asymmetry. Tongue, uvula, palate midline.  Motor: Bulk and tone normal, muscle strength 5/5 throughout with no pronator drift.  Sensation to light touch intact.  No extinction to double simultaneous stimulation.  Deep tendon reflexes 1+ throughout, toes downgoing.  Finger to nose testing intact.  Gait narrow-based and steady, able to tandem walk adequately.  Romberg negative.  IMPRESSION: This is a very pleasant 64 yo RH man with a history of back surgery, melanoma, with early onset dementia. MMSE today 25/30 (23/30 in February 2016). He reports good and bad days. Continue Aricept 10mg  daily, refills sent today. We again discussed the importance of controlling vascular risk factors, physical and brain stimulation exercises for brain health. He will follow-up in 1 year and knows to call our office for any changes.  Thank you for allowing me to participate in his care.  Please do not hesitate to call for any questions or concerns.  The duration of this appointment visit was 24 minutes of face-to-face time with the patient.  Greater than 50% of this time was spent in counseling, explanation of diagnosis, planning of further management, and coordination of care.   Ellouise Newer, M.D.   CC: Dr. Moreen Fowler

## 2015-07-02 ENCOUNTER — Telehealth: Payer: Self-pay | Admitting: Neurology

## 2015-07-02 NOTE — Telephone Encounter (Signed)
Left msg for patients wife/cheryl to return my call. Called her @ [phone].

## 2015-07-02 NOTE — Telephone Encounter (Signed)
Mr.  Creese Wife called and was requesting a refill on his medication. She was unsure of the name or how to spell it. She said something similar to Depricil? She said they use [facility] [address]. The contact number is [phone]. Thank you.

## 2015-11-13 ENCOUNTER — Ambulatory Visit: Payer: Commercial Managed Care - HMO | Admitting: Neurology

## 2015-11-13 DIAGNOSIS — Z029 Encounter for administrative examinations, unspecified: Secondary | ICD-10-CM

## 2015-11-14 ENCOUNTER — Other Ambulatory Visit: Payer: Self-pay | Admitting: Neurology

## 2015-11-14 DIAGNOSIS — F0390 Unspecified dementia without behavioral disturbance: Secondary | ICD-10-CM

## 2015-12-05 ENCOUNTER — Other Ambulatory Visit: Payer: Self-pay | Admitting: Neurology

## 2015-12-05 DIAGNOSIS — F0390 Unspecified dementia without behavioral disturbance: Secondary | ICD-10-CM

## 2015-12-06 ENCOUNTER — Other Ambulatory Visit: Payer: Self-pay | Admitting: Neurology

## 2015-12-06 DIAGNOSIS — F0390 Unspecified dementia without behavioral disturbance: Secondary | ICD-10-CM

## 2015-12-06 NOTE — Telephone Encounter (Signed)
Refused RX request: Pt needs an appt.

## 2015-12-07 ENCOUNTER — Telehealth: Payer: Self-pay | Admitting: Neurology

## 2015-12-07 ENCOUNTER — Other Ambulatory Visit: Payer: Self-pay

## 2015-12-07 DIAGNOSIS — F0390 Unspecified dementia without behavioral disturbance: Secondary | ICD-10-CM

## 2015-12-07 MED ORDER — DONEPEZIL HCL 10 MG PO TABS: 10.0000 mg | ORAL_TABLET | Freq: Every day | ORAL | 2 refills | Status: DC

## 2015-12-07 NOTE — Telephone Encounter (Signed)
Rx sent to pharmacy per Dr. Delice Lesch.

## 2015-12-07 NOTE — Telephone Encounter (Signed)
Patient wife called and states that the patient is out of medication and needs a refill she made appt to see dr Delice Lesch in dec that was her first avail please call [phone] if you need to talk to someone

## 2016-03-04 ENCOUNTER — Encounter: Payer: Self-pay | Admitting: Neurology

## 2016-03-04 ENCOUNTER — Ambulatory Visit (INDEPENDENT_AMBULATORY_CARE_PROVIDER_SITE_OTHER): Payer: Commercial Managed Care - HMO | Admitting: Neurology

## 2016-03-04 VITALS — BP 124/72 | HR 75 | Ht 73.0 in | Wt 181.4 lb

## 2016-03-04 DIAGNOSIS — F0390 Unspecified dementia without behavioral disturbance: Secondary | ICD-10-CM | POA: Diagnosis not present

## 2016-03-04 DIAGNOSIS — F03B0 Unspecified dementia, moderate, without behavioral disturbance, psychotic disturbance, mood disturbance, and anxiety: Secondary | ICD-10-CM

## 2016-03-04 MED ORDER — DONEPEZIL HCL 10 MG PO TABS: 10.0000 mg | ORAL_TABLET | Freq: Every day | ORAL | 11 refills | Status: DC

## 2016-03-04 MED ORDER — MEMANTINE HCL 10 MG PO TABS: ORAL_TABLET | ORAL | 11 refills | Status: DC

## 2016-03-04 NOTE — Progress Notes (Signed)
NEUROLOGY FOLLOW UP OFFICE NOTE  Brian [name] [id number]  HISTORY OF PRESENT ILLNESS: I had the pleasure of seeing Brian [name] in follow-up in the neurology clinic on 03/04/2016.  The patient was last seen more than a year ago for early onset cortical dementia and is again accompanied by his wife who helps supplement the history today.  Since his last visit, his wife reports worsening of memory. She becomes tearful and states that he gets confused quite a bit when he tries to talk or remember what he wants to say. He cannot think about what he wants to say. He repeats himself frequently. At one point he was having problems with his medication, running out of pills before they time for refills. His wife has now been keeping count of his medications and keeps them upstairs. He frequently misplaces things, rarely he has put ice cream in the fridge and butter in the freezer. Every now and then he puts his shirt on backward, but overall is independent with dressing and bathing. Hygiene is good. He continues to drive minimally without getting lost. No personality changes or hallucinations. He is tolerating Aricept 10mg , but his wife reports some loose stools. He denies any headaches, dizziness, diplopia, dysarthria/dysphagia, neck/back pain, focal numbness/tingling, bowel/bladder dysfunction.   HPI 12/12/2013: This is a very pleasant 65 yo RH man with a history of back surgery, melanoma,with memory loss since the beginning of 2015. The patient reports that he has to "dig in my head to pull something out." He is usually good with names but started forgetting them. He has not gotten lost driving, but would get turned around, asking his wife how to get to a certain place. 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 He did not pass a  qualification test for weapons because of depth perception. He saw his ophthalmologist and underwent cataract surgery in November 2014. 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 He had difficulties following instructions, and felt that the instructor was going so fast. He had a hard time filling out the bubbles on a multiple choice questionnaire. He got hired to work for [employer] last July, but came home one day telling his wife he had problems with his reports, that his supervisor was losing patience with him because he can't seem to do things. His wife has also noticed that he would ask the same questions repeatedly. He would say he fed their pets, but actually did not. He would forget to do chores at home such as bringing out the trash. His wife has been in charge of bills for the past 1-1/2 years as a Lexicographer, they denied any missed bill payments. He states he has always had word-finding difficulties.No history of head injuries or falls. There is no family history of memory problems.  Diagnostic Data: I personally reviewed MRI brain with and without contrast which showed patchy and confluent cerebral white matter T2 and FLAIR hyperintensities bilaterally, periatrial white matter is most affected, more over the posterior hemispheres. There are multiple small chronic micro hemorrhages identified on series 12 with susceptibility weighted imaging. Mostly these are in the posterior hemispheres. They demonstrate no associated intrinsic T1 hyperintensity or postcontrast enhancement. No  abnormal enhancement identified. Mesial temporal lobe structures are symmetric and within normal limits for age.   Neuropsychological testing indicated serious impairments in multiple domains. He had prominent impairment of visual-perceptual processing, left-sided visual suppressions to double simultaneous stimulation though no  signs of spatial inattention, visual and auditory memory impairment, deficits in verbal fluency, and executive dysfunction. There was no evidence of affective distress. Testing strongly indicated cerebral dysfunction suggestive of a neurodegenerative process, most consistent with a cortical dementia. Recommendations included assistance with more complex daily activities, not to drive until he undergoes a formal on-the-road driving evaluation, monitor mood. It was noted that he is clearly disabled from any type of competitive employment and will be for the forseeable future.   Laboratory Data: 10/2013 B12 448, 06/2013 TSH 1.79, CBC and CMP normal.  PAST MEDICAL HISTORY: Past Medical History:  Diagnosis Date  . Alzheimer disease   . Melanotic neuroectodermal tumor   . Reflux   . Sexual dysfunction    Corrected  . Wears hearing aid     MEDICATIONS:  Outpatient Encounter Prescriptions as of 03/04/2016  Medication Sig Note  . aspirin 325 MG tablet Take 325 mg by mouth daily.   . Cyanocobalamin (VITAMIN B-12 PO) Take 5,000 mcg by mouth daily.    Marland Kitchen DEXILANT 60 MG capsule Take 1 capsule by mouth daily. 02/13/2014: Received from: External Pharmacy  . donepezil (ARICEPT) 10 MG tablet Take 1 tablet (10 mg total) by mouth daily.   . Loratadine (ALAVERT PO) Take by mouth daily.   Marland Kitchen olopatadine (PATANOL) 0.1 % ophthalmic solution INSTILL 1 DROP INTO BOTH EYES ONCE DAILY AS DIRECTED 11/13/2014: Received from: External Pharmacy  . traZODone (DESYREL) 100 MG tablet 100 mg. Take 1 tablet at bedtime 11/13/2014: Received from: External Pharmacy Received Sig:   . VIAGRA 100 MG tablet Take 100 mg by mouth as needed for erectile dysfunction.    . [DISCONTINUED] donepezil (ARICEPT) 10 MG tablet Take 1 tablet (10 mg total) by mouth daily.   . memantine (NAMENDA) 10 MG tablet Take 1 tablet daily for 1 week, then increase to 1 tablet twice a day    No facility-administered encounter medications on file as of  03/04/2016.     ALLERGIES: Allergies  Allergen Reactions  . Amoxicillin     Bothers Stomach    FAMILY HISTORY: No family history on file.  SOCIAL HISTORY: Social History   Social History  . Marital status: Married    Spouse name: N/A  . Number of children: N/A  . Years of education: N/A   Occupational History  . Not on file.   Social History Main Topics  . Smoking status: Never Smoker  . Smokeless tobacco: Not on file  . Alcohol use 0.0 oz/week     Comment: 3 Beers per Day  . Drug use: No  . Sexual activity: Not on file   Other Topics Concern  . Not on file   Social History Narrative  . No narrative on file    REVIEW OF SYSTEMS: Constitutional: No fevers, chills, or sweats, no generalized fatigue, change in appetite Eyes: No visual changes, double vision, eye pain Ear, nose and throat: No hearing loss, ear pain, nasal congestion, sore throat Cardiovascular: No chest pain, palpitations Respiratory:  No shortness of breath at rest or with exertion, wheezes GastrointestinaI: No nausea, vomiting, diarrhea, abdominal pain, fecal incontinence Genitourinary:  No dysuria, urinary retention or frequency Musculoskeletal:  No neck pain, back pain Integumentary: No rash, pruritus, skin lesions  Neurological: as above Psychiatric: No depression, insomnia, anxiety Endocrine: No palpitations, fatigue, diaphoresis, mood swings, change in appetite, change in weight, increased thirst Hematologic/Lymphatic:  No anemia, purpura, petechiae. Allergic/Immunologic: no itchy/runny eyes, nasal congestion, recent allergic reactions, rashes  PHYSICAL EXAM: Vitals:   03/04/16 1405  BP: 124/72  Pulse: 75   General: No acute distress Head:  Normocephalic/atraumatic Neck: supple, no paraspinal tenderness, full range of motion Heart:  Regular rate and rhythm Lungs:  Clear to auscultation bilaterally Back: No paraspinal tenderness Skin/Extremities: No rash, no edema Neurological  Exam: alert and oriented to person, place, and month/season. No aphasia or dysarthria. Fund of knowledge is appropriate.  Recent and remote memory are intact.  Attention and concentration are normal.    Able to name objects and repeat phrases. CDT 1/5 MMSE - Mini Mental State Exam 03/04/2016 11/13/2014 05/15/2014  Orientation to time 2 5 5   Orientation to Place 4 5 5   Registration 3 3 3   Attention/ Calculation 3 3 1   Recall 2 2 2   Language- name 2 objects 2 2 2   Language- repeat 1 1 1   Language- follow 3 step command 2 2 2   Language- read & follow direction 1 1 1   Write a sentence 1 1 1   Copy design 0 0 0  Total score 21 25 23    Cranial nerves: Pupils equal, round, reactive to light. Extraocular movements intact with no nystagmus. Visual fields full. Facial sensation intact. No facial asymmetry. Tongue, uvula, palate midline.  Motor: Bulk and tone normal, muscle strength 5/5 throughout with no pronator drift.  Sensation to light touch intact.  No extinction to double simultaneous stimulation.  Deep tendon reflexes 1+ throughout, toes downgoing.  Finger to nose testing intact.  Gait narrow-based and steady, able to tandem walk adequately.  Romberg negative.  IMPRESSION: This is a very pleasant 65 yo RH man with a history of back surgery, melanoma, with early onset dementia. MMSE today 21/30 (25/30 in August 2016, 23/30 in February 2016). There has been a decline since his last visit. He is taking Aricept 10mg  daily and will add on Namenda 10mg  daily for 1 week, then increase to 10mg  BID. Side effects were discussed. We again discussed home safety and driving, would hold off on driving. He drives very minimally at this point. We again discussed the importance of controlling vascular risk factors, physical and brain stimulation exercises for brain health. He will follow-up in 6 months and knows to call our office for any changes.  Thank you for allowing me to participate in his care.  Please do not  hesitate to call for any questions or concerns.  The duration of this appointment visit was 25 minutes of face-to-face time with the patient.  Greater than 50% of this time was spent in counseling, explanation of diagnosis, planning of further management, and coordination of care.   Ellouise Newer, M.D.   CC: Dr. Moreen Fowler

## 2016-03-04 NOTE — Patient Instructions (Signed)
1. Start Namenda 10mg : Take 1 tablet daily for 1 week, then increase to 1 tablet twice a day 2. Continue Aricept 10mg  daily 3. Physical exercise and brain stimulation exercises are important for brain health 4. Follow-up in 6 months, call for any changes

## 2016-03-20 ENCOUNTER — Ambulatory Visit (INDEPENDENT_AMBULATORY_CARE_PROVIDER_SITE_OTHER): Payer: Medicare Other | Admitting: Podiatry

## 2016-03-20 ENCOUNTER — Encounter: Payer: Self-pay | Admitting: Podiatry

## 2016-03-20 ENCOUNTER — Ambulatory Visit (INDEPENDENT_AMBULATORY_CARE_PROVIDER_SITE_OTHER): Payer: Medicare Other

## 2016-03-20 VITALS — Resp 16 | Ht 73.0 in | Wt 181.0 lb

## 2016-03-20 DIAGNOSIS — M2010 Hallux valgus (acquired), unspecified foot: Secondary | ICD-10-CM

## 2016-03-20 DIAGNOSIS — M779 Enthesopathy, unspecified: Secondary | ICD-10-CM

## 2016-03-20 DIAGNOSIS — M25571 Pain in right ankle and joints of right foot: Secondary | ICD-10-CM

## 2016-03-20 DIAGNOSIS — Q828 Other specified congenital malformations of skin: Secondary | ICD-10-CM | POA: Diagnosis not present

## 2016-03-20 MED ORDER — TRIAMCINOLONE ACETONIDE 10 MG/ML IJ SUSP
10.0000 mg | Freq: Once | INTRAMUSCULAR | Status: AC
  Administered 2016-03-20: 10 mg

## 2016-03-20 NOTE — Progress Notes (Signed)
Chief Complaint  Patient presents with  . Foot Pain    Left foot, lateral side below 5th toe x 6 months. There is also a skin lesion in the area.   . Skin Lesion    Left foot, plantar forefoot x 6 months or more. Pt states that "the area is painful"  . Ankle Pain    Right ankle, lateral side x 8 months. Pt's wife states that "it looks as if the bone is sticking out", Denies injury.

## 2016-03-21 NOTE — Progress Notes (Signed)
Subjective:     Patient ID: Brian [name], male   DOB: 04-19-1950, 66 y.o.   MRN: [id number]  HPI patient presents with wife stating that he has a painful inflammation around his left lateral foot and has lesions underneath both feet that are painful when palpated he does have dementia so it's difficult to make complete understanding about what's going on   Review of Systems  All other systems reviewed and are negative.      Objective:   Physical Exam  Constitutional: He is oriented to person, place, and time.  Cardiovascular: Intact distal pulses.   Musculoskeletal: Normal range of motion.  Neurological: He is oriented to person, place, and time.  Skin: Skin is warm.  Nursing note and vitals reviewed.  neurovascular status intact muscle strength was adequate range of motion within normal limits. Patient's found have inflammation fluid buildup around the fifth metatarsal left with fluid buildup around the joint and keratotic lesion formation around this area and also submetatarsal bilateral     Assessment:     Inflammatory capsulitis fifth MPJ left with keratotic lesions and keratotic lesions plantar aspect both feet    Plan:     H&P condition reviewed and today careful injection administered of the fifth MPJ 3 mg Kenalog 5 mg Xylocaine and after appropriate numbness debrided lesions there and underneath both feet with complete resolution of symptoms accomplished

## 2016-07-23 ENCOUNTER — Ambulatory Visit (INDEPENDENT_AMBULATORY_CARE_PROVIDER_SITE_OTHER): Payer: Medicare Other

## 2016-07-23 ENCOUNTER — Ambulatory Visit (INDEPENDENT_AMBULATORY_CARE_PROVIDER_SITE_OTHER): Payer: Medicare Other | Admitting: Podiatry

## 2016-07-23 DIAGNOSIS — M21629 Bunionette of unspecified foot: Secondary | ICD-10-CM

## 2016-07-23 DIAGNOSIS — Q828 Other specified congenital malformations of skin: Secondary | ICD-10-CM | POA: Diagnosis not present

## 2016-07-23 DIAGNOSIS — M25571 Pain in right ankle and joints of right foot: Secondary | ICD-10-CM

## 2016-07-23 NOTE — Patient Instructions (Signed)
Pre-Operative Instructions  Congratulations, you have decided to take an important step to improving your quality of life.  You can be assured that the doctors of [facility] will be with you every step of the way.  1. Plan to be at the surgery center/hospital at least 1 (one) hour prior to your scheduled time unless otherwise directed by the surgical center/hospital staff.  You must have a responsible adult accompany you, remain during the surgery and drive you home.  Make sure you have directions to the surgical center/hospital and know how to get there on time. 2. For hospital based surgery you will need to obtain a history and physical form from your family physician within 1 month prior to the date of surgery- we will give you a form for you primary physician.  3. We make every effort to accommodate the date you request for surgery.  There are however, times where surgery dates or times have to be moved.  We will contact you as soon as possible if a change in schedule is required.   4. No Aspirin/Ibuprofen for one week before surgery.  If you are on aspirin, any non-steroidal anti-inflammatory medications (Mobic, Aleve, Ibuprofen) you should stop taking it 7 days prior to your surgery.  You make take Tylenol  For pain prior to surgery.  5. Medications- If you are taking daily heart and blood pressure medications, seizure, reflux, allergy, asthma, anxiety, pain or diabetes medications, make sure the surgery center/hospital is aware before the day of surgery so they may notify you which medications to take or avoid the day of surgery. 6. No food or drink after midnight the night before surgery unless directed otherwise by surgical center/hospital staff. 7. No alcoholic beverages 24 hours prior to surgery.  No smoking 24 hours prior to or 24 hours after surgery. 8. Wear loose pants or shorts- loose enough to fit over bandages, boots, and casts. 9. No slip on shoes, sneakers are best. 10. Bring  your boot with you to the surgery center/hospital.  Also bring crutches or a walker if your physician has prescribed it for you.  If you do not have this equipment, it will be provided for you after surgery. 11. If you have not been contracted by the surgery center/hospital by the day before your surgery, call to confirm the date and time of your surgery. 12. Leave-time from work may vary depending on the type of surgery you have.  Appropriate arrangements should be made prior to surgery with your employer. 13. Prescriptions will be provided immediately following surgery by your doctor.  Have these filled as soon as possible after surgery and take the medication as directed. 14. Remove nail polish on the operative foot. 15. Wash the night before surgery.  The night before surgery wash the foot and leg well with the antibacterial soap provided and water paying special attention to beneath the toenails and in between the toes.  Rinse thoroughly with water and dry well with a towel.  Perform this wash unless told not to do so by your physician.  Enclosed: 1 Ice pack (please put in freezer the night before surgery)   1 Hibiclens skin cleaner   Pre-op Instructions  If you have any questions regarding the instructions, do not hesitate to call our office.  [place]: [address] [place], [place] [address] [phone]  [place]: [address] [place], [place] [address] [phone]  [place]: [address]  [place], [place] [address] [phone]   Dr.   Norman Regal DPM, Dr. Matthew Wagoner DPM, Dr. M. Todd Hyatt DPM, Dr. Titorya Stover DPM 

## 2016-08-04 ENCOUNTER — Telehealth: Payer: Self-pay | Admitting: *Deleted

## 2016-08-04 NOTE — Telephone Encounter (Signed)
"  Calling in reference to a pre authorization request.  The request number is [id number] canceling request because he has Medicare as his primary insurance.  If you have any questions call customer service."

## 2016-08-12 ENCOUNTER — Encounter: Payer: Self-pay | Admitting: Podiatry

## 2016-08-12 DIAGNOSIS — S99922A Unspecified injury of left foot, initial encounter: Secondary | ICD-10-CM | POA: Diagnosis not present

## 2016-08-12 DIAGNOSIS — D492 Neoplasm of unspecified behavior of bone, soft tissue, and skin: Secondary | ICD-10-CM | POA: Diagnosis not present

## 2016-08-12 DIAGNOSIS — M21622 Bunionette of left foot: Secondary | ICD-10-CM | POA: Diagnosis not present

## 2016-08-12 DIAGNOSIS — M65872 Other synovitis and tenosynovitis, left ankle and foot: Secondary | ICD-10-CM | POA: Diagnosis not present

## 2016-08-12 DIAGNOSIS — D485 Neoplasm of uncertain behavior of skin: Secondary | ICD-10-CM | POA: Diagnosis not present

## 2016-08-12 DIAGNOSIS — G4730 Sleep apnea, unspecified: Secondary | ICD-10-CM | POA: Diagnosis not present

## 2016-08-12 DIAGNOSIS — M2012 Hallux valgus (acquired), left foot: Secondary | ICD-10-CM | POA: Diagnosis not present

## 2016-08-12 DIAGNOSIS — M722 Plantar fascial fibromatosis: Secondary | ICD-10-CM | POA: Diagnosis not present

## 2016-08-12 DIAGNOSIS — D2372 Other benign neoplasm of skin of left lower limb, including hip: Secondary | ICD-10-CM | POA: Diagnosis not present

## 2016-08-12 DIAGNOSIS — M7671 Peroneal tendinitis, right leg: Secondary | ICD-10-CM | POA: Diagnosis not present

## 2016-08-14 NOTE — Progress Notes (Signed)
Subjective:    Patient ID: Brian [name], male   DOB: 66 y.o.   MRN: [id number]   HPI patient presents stating he's had pain in the left foot and he was concerned about this    ROS      Objective:  Physical Exam neurovascular status intact with patient found to have lesion formation left with pain and Taylor's bunion deformity     Assessment:    Chronic Taylor's bunion deformity with lesion formation that are increasingly sore and making it hard for him to wear shoe gear     Plan:    Reviewed case with his wife and due to long-standing nature problem and failure to respond conservatively it's been recommended that we go ahead and do fifth metatarsal head resection with resection of lesions on the right. I explained the risk of procedure and allowed the caregiver to read consent form going over alternative treatments complications and he wants surgery signed consent form after extensive review. Scheduled for outpatient surgery at this time at [facility] specialty surgical center and was encouraged to call with any questions prior to procedure

## 2016-08-15 NOTE — Progress Notes (Signed)
DOS 05.29.2018 Removal 5th metatarsal head 5th left, excision skin wedge left 5th met.

## 2016-08-21 ENCOUNTER — Ambulatory Visit (INDEPENDENT_AMBULATORY_CARE_PROVIDER_SITE_OTHER): Payer: Medicare Other | Admitting: Podiatry

## 2016-08-21 ENCOUNTER — Ambulatory Visit (INDEPENDENT_AMBULATORY_CARE_PROVIDER_SITE_OTHER): Payer: Medicare Other

## 2016-08-21 VITALS — Temp 96.3°F

## 2016-08-21 DIAGNOSIS — M21629 Bunionette of unspecified foot: Secondary | ICD-10-CM | POA: Diagnosis not present

## 2016-08-21 DIAGNOSIS — M2010 Hallux valgus (acquired), unspecified foot: Secondary | ICD-10-CM

## 2016-08-21 DIAGNOSIS — Q828 Other specified congenital malformations of skin: Secondary | ICD-10-CM | POA: Diagnosis not present

## 2016-08-21 NOTE — Progress Notes (Signed)
Subjective:    Patient ID: Brian [name], male   DOB: 66 y.o.   MRN: [id number]   HPI patient states she's doing well with minimal discomfort or swelling    ROS      Objective:  Physical Exam neurovascular status intact with discomfort in the lateral side fifth metatarsal with wound edges well coapted and no erythema edema drainage noted currently     Assessment:    Doing well post fifth metatarsal head resection and removal of lesion left     Plan:    Reviewed continued conservative treatment and reapplied sterile dressing and continue elevation immobilization and reappoint 2 weeks suture removal or earlier if needed  X-rays indicate satisfactory section of bone

## 2016-09-02 ENCOUNTER — Ambulatory Visit: Payer: Commercial Managed Care - HMO | Admitting: Neurology

## 2016-09-02 DIAGNOSIS — Z029 Encounter for administrative examinations, unspecified: Secondary | ICD-10-CM

## 2016-09-03 ENCOUNTER — Encounter: Payer: Self-pay | Admitting: Neurology

## 2016-09-04 ENCOUNTER — Ambulatory Visit (INDEPENDENT_AMBULATORY_CARE_PROVIDER_SITE_OTHER): Payer: Medicare Other

## 2016-09-04 ENCOUNTER — Ambulatory Visit (INDEPENDENT_AMBULATORY_CARE_PROVIDER_SITE_OTHER): Payer: Medicare Other | Admitting: Podiatry

## 2016-09-04 ENCOUNTER — Ambulatory Visit: Payer: Medicare Other | Admitting: Podiatry

## 2016-09-04 DIAGNOSIS — M21629 Bunionette of unspecified foot: Secondary | ICD-10-CM

## 2016-09-04 DIAGNOSIS — M2010 Hallux valgus (acquired), unspecified foot: Secondary | ICD-10-CM | POA: Diagnosis not present

## 2016-09-04 NOTE — Progress Notes (Signed)
Subjective:    Patient ID: Brian [name], male   DOB: 66 y.o.   MRN: [id number]   HPI patient presents stating he's doing fine    ROS      Objective:  Physical Exam neurovascular status intact negative Homans sign was noted with patient's left foot showing good healing of the fifth metatarsal with wound edges well healed and crusted over     Assessment:   Doing well post fifth metatarsal head resection left     Plan:    Stitches removed wound edges well coapted sterile dressing and compression stocking applied and advised a continued elevation and gradual increase in activity levels

## 2016-12-27 DIAGNOSIS — Z23 Encounter for immunization: Secondary | ICD-10-CM | POA: Diagnosis not present

## 2017-01-15 ENCOUNTER — Telehealth: Payer: Self-pay | Admitting: Neurology

## 2017-01-15 NOTE — Telephone Encounter (Signed)
Patient's wife called needing to drop off FMLA paperwork for her for when she needs to be out wit him. Also, she needs to have turned in by 01/24/17. Thanks

## 2017-01-15 NOTE — Telephone Encounter (Signed)
Patient was last seen in December 2017. He no showed appt in June and never rescheduled. Would you fill out paperwork for patient/wife?

## 2017-01-15 NOTE — Telephone Encounter (Signed)
Patient's wife called back to get him a follow up appt scheduled. She said he is not doing well. He is having trouble putting on a shirt the right way and more confused. She is wondering should his meds be changed or increased? The next follow up is in April and he is on a wait list. Please Advise. Thanks

## 2017-01-16 NOTE — Telephone Encounter (Signed)
I can fill out paperwork. Can add him on to my urgent work-in slot on 12/6. Thanks

## 2017-01-16 NOTE — Telephone Encounter (Signed)
Patient's wife made aware. Appt moved sooner.

## 2017-01-21 ENCOUNTER — Telehealth: Payer: Self-pay | Admitting: Neurology

## 2017-01-21 NOTE — Telephone Encounter (Signed)
Paper work faxed to number listed below.  Spoke with Pt's wife, Malachy Mood, letting her know that paperwork is at the front desk waiting for pick up

## 2017-01-21 NOTE — Telephone Encounter (Signed)
Patient's wife called with the Fax # for the [facility] paperwork [phone]. Thanks

## 2017-02-19 ENCOUNTER — Ambulatory Visit (INDEPENDENT_AMBULATORY_CARE_PROVIDER_SITE_OTHER): Payer: Medicare Other | Admitting: Neurology

## 2017-02-19 ENCOUNTER — Encounter: Payer: Self-pay | Admitting: Neurology

## 2017-02-19 DIAGNOSIS — F0390 Unspecified dementia without behavioral disturbance: Secondary | ICD-10-CM | POA: Diagnosis not present

## 2017-02-19 DIAGNOSIS — F03B0 Unspecified dementia, moderate, without behavioral disturbance, psychotic disturbance, mood disturbance, and anxiety: Secondary | ICD-10-CM

## 2017-02-19 MED ORDER — DONEPEZIL HCL 10 MG PO TABS: 10.0000 mg | ORAL_TABLET | Freq: Every day | ORAL | 11 refills | Status: DC

## 2017-02-19 MED ORDER — MEMANTINE HCL 10 MG PO TABS: ORAL_TABLET | ORAL | 11 refills | Status: DC

## 2017-02-19 MED ORDER — ESCITALOPRAM OXALATE 5 MG PO TABS: 5.0000 mg | ORAL_TABLET | Freq: Every day | ORAL | 6 refills | Status: DC

## 2017-02-19 NOTE — Progress Notes (Signed)
NEUROLOGY FOLLOW UP OFFICE NOTE  Brian [name] [id number]  HISTORY OF PRESENT ILLNESS: I had the pleasure of seeing Brian [name] in follow-up in the neurology clinic on 02/19/2017.  The patient was last seen a year ago for early onset cortical dementia and is again accompanied by his wife who helps supplement the history today. MMSE 21/30 in December 2017. Namenda was added on to Aricept. No side effects. His wife called last month to report he is not doing well, he is having trouble putting on his shirt the right way and is more confused. He is missing one sock today. He is able to bathe independently. His wife reports that he manages his own medications, once in a while she asks him about the pill number, but he does okay overall. He states he remembers to do it, but sometimes does it wrong. They tried using a pillbox, which he did not like. He would rather take the medications from the bottles. He has some word-finding difficulties noted today. He does not drive. His wife is in charge of finances. His wife feels he is a little depressed, he is more quiet and down on himself. No paranoia or hallucinations. He denies any headaches, dizziness, diplopia, dysarthria/dysphagia, neck/back pain, focal numbness/tingling, bowel/bladder dysfunction. No falls.   HPI 12/12/2013: This is a very pleasant 66 yo RH man with a history of back surgery, melanoma,with memory loss since the beginning of 2015. The patient reports that he has to "dig in my head to pull something out." He is usually good with names but started forgetting them. He has not gotten lost driving, but would get turned around, asking his wife how to get to a certain place. 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 He did not pass a qualification test for weapons because of  depth perception. He saw his ophthalmologist and underwent cataract surgery in November 2014. 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 He had difficulties following instructions, and felt that the instructor was going so fast. He had a hard time filling out the bubbles on a multiple choice questionnaire. He got hired to work for [employer] last July, but came home one day telling his wife he had problems with his reports, that his supervisor was losing patience with him because he can't seem to do things. His wife has also noticed that he would ask the same questions repeatedly. He would say he fed their pets, but actually did not. He would forget to do chores at home such as bringing out the trash. His wife has been in charge of bills for the past 1-1/2 years as a Lexicographer, they denied any missed bill payments. He states he has always had word-finding difficulties.No history of head injuries or falls. There is no family history of memory problems.  Diagnostic Data: I personally reviewed MRI brain with and without contrast which showed patchy and confluent cerebral white matter T2 and FLAIR hyperintensities bilaterally, periatrial white matter is most affected, more over the posterior hemispheres. There are multiple small chronic micro hemorrhages identified on series 12 with susceptibility weighted imaging. Mostly these are in the posterior hemispheres. They demonstrate no associated intrinsic T1 hyperintensity or postcontrast  enhancement. No abnormal enhancement identified. Mesial temporal lobe structures are symmetric and within normal limits for age.   Neuropsychological testing indicated serious impairments in multiple domains. He had prominent impairment of visual-perceptual processing, left-sided visual suppressions to double simultaneous stimulation though no signs of spatial inattention, visual and  auditory memory impairment, deficits in verbal fluency, and executive dysfunction. There was no evidence of affective distress. Testing strongly indicated cerebral dysfunction suggestive of a neurodegenerative process, most consistent with a cortical dementia. Recommendations included assistance with more complex daily activities, not to drive until he undergoes a formal on-the-road driving evaluation, monitor mood. It was noted that he is clearly disabled from any type of competitive employment and will be for the forseeable future.   Laboratory Data: 10/2013 B12 448, 06/2013 TSH 1.79, CBC and CMP normal.  PAST MEDICAL HISTORY: Past Medical History:  Diagnosis Date  . Alzheimer disease   . Melanotic neuroectodermal tumor   . Reflux   . Sexual dysfunction    Corrected  . Wears hearing aid     MEDICATIONS:  Outpatient Encounter Medications as of 02/19/2017  Medication Sig Note  . aspirin 325 MG tablet Take 325 mg by mouth daily.   . Cyanocobalamin (VITAMIN B-12 PO) Take 5,000 mcg by mouth daily.    Marland Kitchen DEXILANT 60 MG capsule Take 1 capsule by mouth daily. 02/13/2014: Received from: External Pharmacy  . donepezil (ARICEPT) 10 MG tablet Take 1 tablet (10 mg total) by mouth daily.   Marland Kitchen HYDROcodone-acetaminophen (NORCO) 10-325 MG tablet Take 1 tablet by mouth every 4 (four) hours as needed.   . Loratadine (ALAVERT PO) Take by mouth daily.   . memantine (NAMENDA) 10 MG tablet Take 1 tablet daily for 1 week, then increase to 1 tablet twice a day   . olopatadine (PATANOL) 0.1 % ophthalmic solution INSTILL 1 DROP INTO BOTH EYES ONCE DAILY AS DIRECTED 11/13/2014: Received from: External Pharmacy  . traZODone (DESYREL) 100 MG tablet 100 mg. Take 1 tablet at bedtime 11/13/2014: Received from: External Pharmacy Received Sig:   . VIAGRA 100 MG tablet Take 100 mg by mouth as needed for erectile dysfunction.     No facility-administered encounter medications on file as of 02/19/2017.      ALLERGIES: Allergies  Allergen Reactions  . Amoxicillin     Bothers Stomach    FAMILY HISTORY: No family history on file.  SOCIAL HISTORY: Social History   Socioeconomic History  . Marital status: Married    Spouse name: Not on file  . Number of children: Not on file  . Years of education: Not on file  . Highest education level: Not on file  Social Needs  . Financial resource strain: Not on file  . Food insecurity - worry: Not on file  . Food insecurity - inability: Not on file  . Transportation needs - medical: Not on file  . Transportation needs - non-medical: Not on file  Occupational History  . Not on file  Tobacco Use  . Smoking status: Never Smoker  Substance and Sexual Activity  . Alcohol use: Yes    Alcohol/week: 0.0 oz    Comment: 3 Beers per Day  . Drug use: No  . Sexual activity: Not on file  Other Topics Concern  . Not on file  Social History Narrative  . Not on file    REVIEW OF SYSTEMS: Constitutional: No fevers, chills, or sweats, no generalized fatigue, change in appetite Eyes: No visual changes, double vision, eye pain Ear,  nose and throat: No hearing loss, ear pain, nasal congestion, sore throat Cardiovascular: No chest pain, palpitations Respiratory:  No shortness of breath at rest or with exertion, wheezes GastrointestinaI: No nausea, vomiting, diarrhea, abdominal pain, fecal incontinence Genitourinary:  No dysuria, urinary retention or frequency Musculoskeletal:  No neck pain, back pain Integumentary: No rash, pruritus, skin lesions Neurological: as above Psychiatric: No depression, insomnia, anxiety Endocrine: No palpitations, fatigue, diaphoresis, mood swings, change in appetite, change in weight, increased thirst Hematologic/Lymphatic:  No anemia, purpura, petechiae. Allergic/Immunologic: no itchy/runny eyes, nasal congestion, recent allergic reactions, rashes  PHYSICAL EXAM: Vitals:   02/19/17 1128  BP: 130/84  Pulse: 69   SpO2: 97%   General: No acute distress Head:  Normocephalic/atraumatic Neck: supple, no paraspinal tenderness, full range of motion Heart:  Regular rate and rhythm Lungs:  Clear to auscultation bilaterally Back: No paraspinal tenderness Skin/Extremities: No rash, no edema Neurological Exam: alert and oriented to person, place, and month/season. No aphasia or dysarthria. Fund of knowledge is appropriate.  Recent and remote memory are intact.  Attention and concentration are normal.    Able to name objects and repeat phrases. CDT 1/5 MMSE - Mini Mental State Exam 02/19/2017 03/04/2016 11/13/2014 05/15/2014  Orientation to time 1 2 5 5   Orientation to Place 4 4 5 5   Registration 2 3 3 3   Attention/ Calculation 0 3 3 1   Recall 0 2 2 2   Language- name 2 objects 1 2 2 2   Language- repeat 1 1 1 1   Language- follow 3 step command 2 2 2 2   Language- read & follow direction 0 1 1 1   Write a sentence 0 1 1 1   Copy design 0 0 0 0  Total score 11 21 25 23    Cranial nerves: Pupils equal, round, reactive to light. Extraocular movements intact with no nystagmus. Visual fields full. Facial sensation intact. No facial asymmetry. Tongue, uvula, palate midline.  Motor: Bulk and tone normal, muscle strength 5/5 throughout with no pronator drift.  Sensation to light touch intact.  No extinction to double simultaneous stimulation.  Deep tendon reflexes 1+ throughout, toes downgoing.  Finger to nose testing intact.  Gait narrow-based and steady, able to tandem walk adequately.  Romberg negative.  IMPRESSION: This is a very pleasant 66 yo RH man with a history of back surgery, melanoma, with early onset dementia. MMSE today 11/30 (21/30 in December 2017, 25/30 in August 2016, 23/30 in February 2016). There is continued decline noted, he is on Aricept 10mg  daily and Namenda 10mg  BID. His wife feels he is a little depressed, we discussed mood changes that can occur with dementia, start Lexapro 5mg  daily, side effects  were discussed. We again discussed home safety and looking into day programs, they will be connected with [facility] through the [employer] to help with local resources. He has stopped driving. We again discussed the importance of controlling vascular risk factors, physical and brain stimulation exercises for brain health. He will follow-up in 6 months and knows to call our office for any changes.  Thank you for allowing me to participate in his care.  Please do not hesitate to call for any questions or concerns.  The duration of this appointment visit was 25 minutes of face-to-face time with the patient.  Greater than 50% of this time was spent in counseling, explanation of diagnosis, planning of further management, and coordination of care.   Ellouise Newer, M.D.   CC: Dr. Moreen Fowler

## 2017-02-19 NOTE — Patient Instructions (Signed)
1. Start Lexapro 5mg  daily 2. Continue Donepezil 10mg  daily and Namenda 10mg  twice a day 3. We will get you connected to [facility] through the [employer] to help with support and local resources, day programs 4. Follow-up in 6 months, call for any changes  FALL PRECAUTIONS: Be cautious when walking. Scan the area for obstacles that may increase the risk of trips and falls. When getting up in the mornings, sit up at the edge of the bed for a few minutes before getting out of bed. Consider elevating the bed at the head end to avoid drop of blood pressure when getting up. Walk always in a well-lit room (use night lights in the walls). Avoid area rugs or power cords from appliances in the middle of the walkways. Use a walker or a cane if necessary and consider physical therapy for balance exercise. Get your eyesight checked regularly.  FINANCIAL OVERSIGHT: Supervision, especially oversight when making financial decisions or transactions is also recommended.  HOME SAFETY: Consider the safety of the kitchen when operating appliances like stoves, microwave oven, and blender. Consider having supervision and share cooking responsibilities until no longer able to participate in those. Accidents with firearms and other hazards in the house should be identified and addressed as well.  DRIVING: Regarding driving, in patients with progressive memory problems, driving will be impaired. We advise to have someone else do the driving if trouble finding directions or if minor accidents are reported. Independent driving assessment is available to determine safety of driving.  ABILITY TO BE LEFT ALONE: If patient is unable to contact 911 operator, consider using LifeLine, or when the need is there, arrange for someone to stay with patients. Smoking is a fire hazard, consider supervision or cessation. Risk of wandering should be assessed by caregiver and if detected at any point, supervision and safe proof  recommendations should be instituted.  MEDICATION SUPERVISION: Inability to self-administer medication needs to be constantly addressed. Implement a mechanism to ensure safe administration of the medications.  RECOMMENDATIONS FOR ALL PATIENTS WITH MEMORY PROBLEMS: 1. Continue to exercise (Recommend 30 minutes of walking everyday, or 3 hours every week) 2. Increase social interactions - continue going to [place] and enjoy social gatherings with friends and family 3. Eat healthy, avoid fried foods and eat more fruits and vegetables 4. Maintain adequate blood pressure, blood sugar, and blood cholesterol level. Reducing the risk of stroke and cardiovascular disease also helps promoting better memory. 5. Avoid stressful situations. Live a simple life and avoid aggravations. Organize your time and prepare for the next day in anticipation. 6. Sleep well, avoid any interruptions of sleep and avoid any distractions in the bedroom that may interfere with adequate sleep quality 7. Avoid sugar, avoid sweets as there is a strong link between excessive sugar intake, diabetes, and cognitive impairment We discussed the Mediterranean diet, which has been shown to help patients reduce the risk of progressive memory disorders and reduces cardiovascular risk. This includes eating fish, eat fruits and green leafy vegetables, nuts like almonds and hazelnuts, walnuts, and also use olive oil. Avoid fast foods and fried foods as much as possible. Avoid sweets and sugar as sugar use has been linked to worsening of memory function.  There is always a concern of gradual progression of memory problems. If this is the case, then we may need to adjust level of care according to patient needs. Support, both to the patient and caregiver, should then be put into place.

## 2017-02-25 ENCOUNTER — Encounter: Payer: Self-pay | Admitting: Neurology

## 2017-07-10 ENCOUNTER — Ambulatory Visit: Payer: Commercial Managed Care - HMO | Admitting: Neurology

## 2017-07-10 ENCOUNTER — Encounter

## 2017-07-26 ENCOUNTER — Emergency Department (HOSPITAL_COMMUNITY): Payer: Medicare Other

## 2017-07-26 ENCOUNTER — Encounter (HOSPITAL_COMMUNITY): Payer: Self-pay | Admitting: Emergency Medicine

## 2017-07-26 ENCOUNTER — Other Ambulatory Visit: Payer: Self-pay

## 2017-07-26 DIAGNOSIS — R410 Disorientation, unspecified: Secondary | ICD-10-CM | POA: Diagnosis not present

## 2017-07-26 DIAGNOSIS — R918 Other nonspecific abnormal finding of lung field: Secondary | ICD-10-CM | POA: Diagnosis not present

## 2017-07-26 DIAGNOSIS — M62838 Other muscle spasm: Secondary | ICD-10-CM | POA: Diagnosis not present

## 2017-07-26 DIAGNOSIS — R079 Chest pain, unspecified: Secondary | ICD-10-CM | POA: Insufficient documentation

## 2017-07-26 DIAGNOSIS — M542 Cervicalgia: Secondary | ICD-10-CM | POA: Diagnosis not present

## 2017-07-26 DIAGNOSIS — R51 Headache: Secondary | ICD-10-CM | POA: Diagnosis not present

## 2017-07-26 DIAGNOSIS — Z79899 Other long term (current) drug therapy: Secondary | ICD-10-CM | POA: Diagnosis not present

## 2017-07-26 DIAGNOSIS — Z7982 Long term (current) use of aspirin: Secondary | ICD-10-CM | POA: Insufficient documentation

## 2017-07-26 DIAGNOSIS — G309 Alzheimer's disease, unspecified: Secondary | ICD-10-CM | POA: Insufficient documentation

## 2017-07-26 LAB — I-STAT TROPONIN, ED: Troponin i, poc: 0 ng/mL (ref 0.00–0.08)

## 2017-07-26 LAB — BASIC METABOLIC PANEL
ANION GAP: 10 (ref 5–15)
BUN: 9 mg/dL (ref 6–20)
CO2: 26 mmol/L (ref 22–32)
CREATININE: 0.64 mg/dL (ref 0.61–1.24)
Calcium: 9.5 mg/dL (ref 8.9–10.3)
Chloride: 100 mmol/L — ABNORMAL LOW (ref 101–111)
GFR calc Af Amer: >60 mL/min (ref >60)
GFR calc non Af Amer: >60 mL/min (ref >60)
Glucose, Bld: 135 mg/dL — ABNORMAL HIGH (ref 65–99)
POTASSIUM: 3.8 mmol/L (ref 3.5–5.1)
Sodium: 136 mmol/L (ref 135–145)

## 2017-07-26 LAB — CBC
HEMATOCRIT: 43.6 % (ref 39.0–52.0)
Hemoglobin: 14.8 g/dL (ref 13.0–17.0)
MCH: 30.9 pg (ref 26.0–34.0)
MCHC: 33.9 g/dL (ref 30.0–36.0)
MCV: 91 fL (ref 78.0–100.0)
PLATELETS: 246 10*3/uL (ref 150–400)
RBC: 4.79 MIL/uL (ref 4.22–5.81)
RDW: 13.5 % (ref 11.5–15.5)
WBC: 9.2 10*3/uL (ref 4.0–10.5)

## 2017-07-26 NOTE — ED Triage Notes (Signed)
Pt reports having headache over the last several days along with elevated blood pressure. Pt family reports that he had pain radiate from neck into shoulder and chest on 07/24/17. Pt currently denies any chest pain.

## 2017-07-27 ENCOUNTER — Emergency Department (HOSPITAL_COMMUNITY)
Admission: EM | Admit: 2017-07-27 | Discharge: 2017-07-27 | Disposition: A | Discharge status: Home / Self Care | Payer: Medicare Other | Attending: Emergency Medicine | Admitting: Emergency Medicine

## 2017-07-27 ENCOUNTER — Emergency Department (HOSPITAL_COMMUNITY): Payer: Medicare Other

## 2017-07-27 DIAGNOSIS — R51 Headache: Secondary | ICD-10-CM | POA: Diagnosis not present

## 2017-07-27 DIAGNOSIS — M62838 Other muscle spasm: Secondary | ICD-10-CM

## 2017-07-27 DIAGNOSIS — R410 Disorientation, unspecified: Secondary | ICD-10-CM

## 2017-07-27 LAB — URINALYSIS, ROUTINE W REFLEX MICROSCOPIC
BILIRUBIN URINE: NEGATIVE
Glucose, UA: NEGATIVE mg/dL
HGB URINE DIPSTICK: NEGATIVE
Ketones, ur: NEGATIVE mg/dL
Leukocytes, UA: NEGATIVE
Nitrite: NEGATIVE
PH: 6 (ref 5.0–8.0)
Protein, ur: NEGATIVE mg/dL
SPECIFIC GRAVITY, URINE: 1.017 (ref 1.005–1.030)

## 2017-07-27 MED ORDER — KETOROLAC TROMETHAMINE 30 MG/ML IJ SOLN
30.0000 mg | Freq: Once | INTRAMUSCULAR | Status: DC
Start: 2017-07-27 — End: 2017-07-27

## 2017-07-27 MED ORDER — KETOROLAC TROMETHAMINE 30 MG/ML IJ SOLN
15.0000 mg | Freq: Once | INTRAMUSCULAR | Status: AC
  Administered 2017-07-27: 15 mg via INTRAMUSCULAR
  Filled 2017-07-27: qty 1

## 2017-07-27 NOTE — ED Provider Notes (Signed)
[facility] DEPT Provider Note   CSN: [id number] Arrival date & time: 07/26/17  2024     History   Chief Complaint Chief Complaint  Patient presents with  . Headache  . Chest Pain    HPI STEEN BISIG is a 67 y.o. male with past medical history of Alzheimer's dementia, presenting to the ED with 2 days of left shoulder and neck pain.  Patient's wife states he had been complaining of pain in the shoulder that is worse with movement of his arm.  She states he also has been holding his head and not wanting to turn his head towards the left.  She denies known injury.  Contrary to triage note, patient's wife states he has not been complaining of chest pain.  Also reports he complained of headache that began last night.  She states he was holding the top of his head and seemed to be sensitive to the light and sound.  Reports she gave him a tramadol this morning which seemed to provide relief.  He has a history of headaches, however they are normally easily relieved with OTC medications.  Reports he has been acting slightly more confused than normal, though denies slurred speech, facial droop, or other obvious deficits.  History difficult to obtain from patient, though denies vision changes, nausea, chest pain.   The history is provided by the spouse. The history is limited by the condition of the patient.    Past Medical History:  Diagnosis Date  . Alzheimer disease   . Melanotic neuroectodermal tumor   . Reflux   . Sexual dysfunction    Corrected  . Wears hearing aid     Patient Active Problem List   Diagnosis Date Noted  . Moderate dementia without behavioral disturbance 03/04/2016  . Dementia 11/13/2014  . Memory loss 12/12/2013  . Abnormal stress test 08/14/2011  . Chest pressure 07/15/2011    Past Surgical History:  Procedure Laterality Date  . CARDIAC CATHETERIZATION  2014  . CARDIOVASCULAR STRESS TEST  12-04-2004   EF 68%  . CARPAL TUNNEL  RELEASE Left 04/12/2014   Procedure: LEFT CARPAL TUNNEL RELEASE;  Surgeon: Charlotte Crumb, MD;  Location: [facility];  Service: Orthopedics;  Laterality: Left;  . COLONOSCOPY    . LUMBAR DISC SURGERY    . NEUROMA SURGERY     Disc Repair  . OPEN REDUCTION INTERNAL FIXATION (ORIF) DISTAL RADIAL FRACTURE Left 04/12/2014   Procedure: OPEN REDUCTION INTERNAL FIXATION (ORIF) LEFT DISTAL RADIAL FRACTURE;  Surgeon: Charlotte Crumb, MD;  Location: [facility];  Service: Orthopedics;  Laterality: Left;  ANESTHESIA: GENERAL/AXILLARY BLOCK  . TONSILLECTOMY     October of 1969  . TONSILLECTOMY          Home Medications    Prior to Admission medications   Medication Sig Start Date End Date Taking? Authorizing Provider  aspirin 325 MG tablet Take 325 mg by mouth daily.    [provider]  Cyanocobalamin (VITAMIN B-12 PO) Take 5,000 mcg by mouth daily.     [provider]  DEXILANT 60 MG capsule Take 1 capsule by mouth daily. 01/23/14   [provider]  donepezil (ARICEPT) 10 MG tablet Take 1 tablet (10 mg total) by mouth daily. 02/19/17   Cameron Sprang, MD  escitalopram (LEXAPRO) 5 MG tablet Take 1 tablet (5 mg total) by mouth daily. 02/19/17   Cameron Sprang, MD  HYDROcodone-acetaminophen [facility]) 10-325 MG tablet Take 1  tablet by mouth every 4 (four) hours as needed. 08/12/16   Wallene Huh, DPM  Loratadine (ALAVERT PO) Take by mouth daily.    [provider]  memantine (NAMENDA) 10 MG tablet Take 1 tablet twice a day 02/19/17   Cameron Sprang, MD  olopatadine (PATANOL) 0.1 % ophthalmic solution INSTILL 1 DROP INTO BOTH EYES ONCE DAILY AS DIRECTED 10/07/14   [provider]  traZODone (DESYREL) 100 MG tablet 100 mg. Take 1 tablet at bedtime 11/12/14   [provider]  VIAGRA 100 MG tablet Take 100 mg by mouth as needed for erectile dysfunction.  06/20/11   [provider]    Family History History reviewed.  No pertinent family history.  Social History Social History   Tobacco Use  . Smoking status: Never Smoker  . Smokeless tobacco: Never Used  Substance Use Topics  . Alcohol use: Yes    Alcohol/week: 0.0 oz    Comment: 3 Beers per Day  . Drug use: No     Allergies   Amoxicillin   Review of Systems Review of Systems  Unable to perform ROS: Dementia  Cardiovascular: Negative for chest pain.  Musculoskeletal: Positive for myalgias and neck pain.  Neurological: Positive for headaches.  Psychiatric/Behavioral: Positive for confusion (chronic).     Physical Exam Updated Vital Signs BP (!) 158/89 (BP Location: Right Arm)   Pulse 67   Temp 98 F (36.7 C) (Oral)   Resp 15   Ht 6\' 1"  (1.854 m)   Wt 83.9 kg (185 lb)   SpO2 98%   BMI 24.41 kg/m   Physical Exam  Constitutional: He appears well-developed and well-nourished. He does not appear ill. No distress.  HENT:  Head: Normocephalic and atraumatic.  Eyes: Conjunctivae are normal.  Cardiovascular: Normal rate, regular rhythm, normal heart sounds and intact distal pulses.  Pulmonary/Chest: Effort normal and breath sounds normal. No respiratory distress.  Abdominal: Soft. Bowel sounds are normal. He exhibits no distension. There is no tenderness. There is no guarding.  Musculoskeletal:  Left SCM muscle with tenderness, and some tenderness to palpation of the trapezius muscle group.  No obvious pain with passive range of motion of shoulder.  No midline spinal tenderness.  Neurological: He is alert.  Unable to completely perform neuro exam secondary to patient's dementia and inability to follow commands.    No facial droop or slurred speech.  Patient is searching for words, and does not answer questions appropriately. PERRL, EOM grossly normal.  Strength grossly normal bilateral upper and lower extremities.   Skin: Skin is warm.  Psychiatric: He has a normal mood and affect. His behavior is normal.  Nursing note and vitals  reviewed.    ED Treatments / Results  Labs (all labs ordered are listed, but only abnormal results are displayed) Labs Reviewed  BASIC METABOLIC PANEL - Abnormal; Notable for the following components:      Result Value   Chloride 100 (*)    Glucose, Bld 135 (*)    All other components within normal limits  CBC  URINALYSIS, ROUTINE W REFLEX MICROSCOPIC  I-STAT TROPONIN, ED    EKG EKG Interpretation  Date/Time:  Sunday Jul 26 2017 23:11:43 EDT Ventricular Rate:  75 PR Interval:    QRS Duration: 92 QT Interval:  392 QTC Calculation: 438 R Axis:   24 Text Interpretation:  Sinus rhythm Baseline wander in lead(s) III aVL aVF No significant change since last tracing 04 Aug 2011 Confirmed by Tomi Bamberger,  Iva [phone]) on 07/27/2017 12:14:46 AM   Radiology Dg Chest 2 View  Result Date: 07/27/2017 CLINICAL DATA:  Acute onset of headache and high blood pressure. EXAM: CHEST - 2 VIEW COMPARISON:  None. FINDINGS: The lungs are well-aerated. Mild left basilar opacity likely reflects atelectasis. There is no evidence of pleural effusion or pneumothorax. The heart is mildly enlarged. No acute osseous abnormalities are seen. IMPRESSION: Mild left basilar opacity likely reflects atelectasis. Mild cardiomegaly. Electronically Signed   By: Garald Balding M.D.   On: 07/27/2017 00:09   Ct Head Wo Contrast  Result Date: 07/27/2017 CLINICAL DATA:  Headache for several days, hypertensive. History of Alzheimer's disease. EXAM: CT HEAD WITHOUT CONTRAST TECHNIQUE: Contiguous axial images were obtained from the base of the skull through the vertex without intravenous contrast. COMPARISON:  MRI of the head November 22, 2013 FINDINGS: BRAIN: No intraparenchymal hemorrhage, mass effect nor midline shift. Mild parenchymal brain volume loss. No hydrocephalus. Patchy to confluent supratentorial white matter hypodensities. No acute large vascular territory infarcts. No abnormal extra-axial fluid collections. Basal cisterns  are patent. VASCULAR: Minimal calcific atherosclerosis of the carotid siphons. SKULL: No skull fracture. No significant scalp soft tissue swelling. SINUSES/ORBITS: The mastoid air-cells and included paranasal sinuses are well-aerated.The included ocular globes and orbital contents are non-suspicious. Status post LEFT ocular lens implant. OTHER: None. IMPRESSION: 1. No acute intracranial process. 2. Moderate parenchymal brain volume loss. 3. Moderate to severe chronic small vessel ischemic changes. Electronically Signed   By: Elon Alas M.D.   On: 07/27/2017 01:42    Procedures Procedures (including critical care time)  Medications Ordered in ED Medications  ketorolac (TORADOL) 30 MG/ML injection 15 mg (has no administration in time range)     Initial Impression / Assessment and Plan / ED Course  I have reviewed the triage vital signs and the nursing notes.  Pertinent labs & imaging results that were available during my care of the patient were reviewed by me and considered in my medical decision making (see chart for details).     Patient presenting to the ED for left sided neck/shoulder pain, episode of headache, and some increase in confusion from baseline.  Overall exam is reassuring.  Neck/shoulder pain likely musculoskeletal in nature.  Cardiac work-up is benign.  No obvious neuro deficits on exam, however unable to fully assess secondary to patient's dementia.  CT head is negative for acute pathology.  UA negative for infection.  Remainder of labs unremarkable.  Pain treated in the ED with Toradol.  Patient discussed with and evaluated by Dr. Rolland Porter, who recommends pt safe for discharge.  Recommend patient follow-up closely with neurologist regarding increased confusion.  Also recommend PCP follow-up to recheck blood pressure.  Strict return precautions discussed.  Safe for discharge.  Discussed results, findings, treatment and follow up. Patient advised of return precautions.  Patient verbalized understanding and agreed with plan.  Final Clinical Impressions(s) / ED Diagnoses   Final diagnoses:  Muscle spasms of neck  Confusion    ED Discharge Orders    None       Shanavia Makela, [place] N, PA-C 07/27/17 0623    Rolland Porter, MD 07/27/17 [phone]

## 2017-07-27 NOTE — ED Notes (Signed)
ED Provider at bedside. 

## 2017-07-27 NOTE — ED Notes (Addendum)
Pt currently has no complaints.  Hx of Alzheimer's.  Family at bedside.

## 2017-07-27 NOTE — ED Notes (Signed)
Pt is attempting to give urine sample at this time 

## 2017-07-27 NOTE — ED Notes (Signed)
Patient transported to CT 

## 2017-07-27 NOTE — ED Notes (Signed)
Pt finished ice water and attempting to provide a urine sample again.  Pt disconnected from monitor and pulse ox d/t fasincation/distraction w/ cords.

## 2017-07-27 NOTE — Discharge Instructions (Signed)
Please read instructions below. Apply heat to your neck, followed by gentle massage. You can take ibuprofen every 6 hours as needed for pain. Schedule close follow up with your neurologist, regarding increase in confusion. Follow up with your primary care to have your blood pressure rechecked. Return to the ER for new or worsening symptoms.

## 2017-07-27 NOTE — ED Provider Notes (Addendum)
Pt has hx of dementia, still ambulatory, still conversive with c/o neck pain. Pt is holding his head forward and turns his body to look from side to side. He is nontender on my exam over his trapezius muscles and denies having pain now. He has to be frequently redirected to sit down on the stretcher.    Medical screening examination/treatment/procedure(s) were conducted as a shared visit with non-physician practitioner(s) and myself.  I personally evaluated the patient during the encounter.  EKG Interpretation  Date/Time:  Sunday Jul 26 2017 23:11:43 EDT Ventricular Rate:  75 PR Interval:    QRS Duration: 92 QT Interval:  392 QTC Calculation: 438 R Axis:   24 Text Interpretation:  Sinus rhythm Baseline wander in lead(s) III aVL aVF No significant change since last tracing 04 Aug 2011 Confirmed by Rolland Porter [phone]) on 07/27/2017 12:14:46 AM     Rolland Porter, MD 07/27/17 [id number]    Rolland Porter, MD 07/27/17 [phone]

## 2017-07-27 NOTE — ED Notes (Signed)
Pt unable to provide urine specimen at this time

## 2017-07-27 NOTE — ED Notes (Signed)
Pt's wife verbalized understanding discharge instructions and follow-up. In no acute distress.

## 2017-07-30 DIAGNOSIS — R030 Elevated blood-pressure reading, without diagnosis of hypertension: Secondary | ICD-10-CM | POA: Diagnosis not present

## 2017-07-30 DIAGNOSIS — M542 Cervicalgia: Secondary | ICD-10-CM | POA: Diagnosis not present

## 2017-07-30 DIAGNOSIS — G309 Alzheimer's disease, unspecified: Secondary | ICD-10-CM | POA: Diagnosis not present

## 2017-07-30 DIAGNOSIS — F329 Major depressive disorder, single episode, unspecified: Secondary | ICD-10-CM | POA: Diagnosis not present

## 2017-08-03 ENCOUNTER — Other Ambulatory Visit: Payer: Self-pay

## 2017-08-03 ENCOUNTER — Encounter: Payer: Self-pay | Admitting: Neurology

## 2017-08-03 ENCOUNTER — Ambulatory Visit (INDEPENDENT_AMBULATORY_CARE_PROVIDER_SITE_OTHER): Payer: Medicare Other | Admitting: Neurology

## 2017-08-03 VITALS — BP 158/82 | HR 72 | Ht 73.0 in | Wt 190.0 lb

## 2017-08-03 DIAGNOSIS — F0390 Unspecified dementia without behavioral disturbance: Secondary | ICD-10-CM

## 2017-08-03 DIAGNOSIS — F03B0 Unspecified dementia, moderate, without behavioral disturbance, psychotic disturbance, mood disturbance, and anxiety: Secondary | ICD-10-CM

## 2017-08-03 MED ORDER — DONEPEZIL HCL 10 MG PO TABS: 10.0000 mg | ORAL_TABLET | Freq: Every day | ORAL | 11 refills | Status: DC

## 2017-08-03 MED ORDER — MEMANTINE HCL 10 MG PO TABS: ORAL_TABLET | ORAL | 11 refills | Status: DC

## 2017-08-03 MED ORDER — ESCITALOPRAM OXALATE 10 MG PO TABS: 10.0000 mg | ORAL_TABLET | Freq: Every day | ORAL | 11 refills | Status: DC

## 2017-08-03 NOTE — Progress Notes (Signed)
NEUROLOGY FOLLOW UP OFFICE NOTE  Brian [name] [id number]  DOB: December 05, 1950  HISTORY OF PRESENT ILLNESS: I had the pleasure of seeing Brian [name] in follow-up in the neurology clinic on 08/03/2017.  The patient was last seen 5 months ago for early onset cortical dementia and is again accompanied by his wife who helps supplement the history today. MMSE 11/30 in December 2018 (21/30 in December 2017). He is taking Aricept 10mg  daily and Namenda 10mg  BID without side effects. He reports doing fine, but does admit that the "wheels are spinning slower" and asks me to repeat questions, looking to his wife for answers, saying he does not understand. His wife has to explain question to him again. His wife was concerned about depression on last visit, he is taking Lexapro 5mg  daily without side effects which may be helping per wife. His wife manages medications and finances. He does not drive. He occasionally puts his shoes on the wrong foot or his shirt on backwards. He needs helping with bathing. He continues to have word-finding difficulties and some paraphasic errors today. He starts getting a little more agitated during the visit. His wife is unsure if he has hallucinations saying someone came into the house, because he calls their dogs people, so she is not sure if he is talking about the dog. His stories keep getting wilder. No paranoia. Sleep is good, no wandering. He was in the ER last week for neck pain/stiffness and headache, he does not remember this, and currently denies any pain. He denies any headaches, dizziness, diplopia, dysarthria/dysphagia, neck/back pain, focal numbness/tingling, bowel/bladder dysfunction. No falls.   HPI 12/12/2013: This is a very pleasant 67 yo RH man with a history of back surgery, melanoma,with memory loss since the beginning of 2015. The patient reports that he has to "dig in my head to pull something out." He is usually good with names but started forgetting them. He  has not gotten lost driving, but would get turned around, asking his wife how to get to a certain place. 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 He did not pass a qualification test for weapons because of depth perception. He saw his ophthalmologist and underwent cataract surgery in November 2014. 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 He had difficulties following instructions, and felt that the instructor was going so fast. He had a hard time filling out the bubbles on a multiple choice questionnaire. He got hired to work for [employer] last July, but came home one day telling his wife he had problems with his reports, that his supervisor was losing patience with him because he can't seem to do things. His wife has also noticed that he would ask the same questions repeatedly. He would say he fed their pets, but actually did not. He would forget to do chores at home such as bringing out the trash. His wife has been in charge of bills for the past 1-1/2 years as a Lexicographer, they denied any missed bill payments. He states he has always had word-finding difficulties.No history of head injuries or falls. There is no family history of memory problems.  Diagnostic Data: I personally reviewed MRI brain with and  without contrast which showed patchy and confluent cerebral white matter T2 and FLAIR hyperintensities bilaterally, periatrial white matter is most affected, more over the posterior hemispheres. There are multiple small chronic micro hemorrhages identified on series 12 with susceptibility weighted imaging. Mostly these are in the posterior hemispheres. They demonstrate no associated intrinsic T1 hyperintensity or postcontrast  enhancement. No abnormal enhancement identified. Mesial temporal lobe structures are symmetric and within normal limits for age.   Neuropsychological testing indicated serious impairments in multiple domains. He had prominent impairment of visual-perceptual processing, left-sided visual suppressions to double simultaneous stimulation though no signs of spatial inattention, visual and auditory memory impairment, deficits in verbal fluency, and executive dysfunction. There was no evidence of affective distress. Testing strongly indicated cerebral dysfunction suggestive of a neurodegenerative process, most consistent with a cortical dementia. Recommendations included assistance with more complex daily activities, not to drive until he undergoes a formal on-the-road driving evaluation, monitor mood. It was noted that he is clearly disabled from any type of competitive employment and will be for the forseeable future.   Laboratory Data: 10/2013 B12 448, 06/2013 TSH 1.79, CBC and CMP normal.  PAST MEDICAL HISTORY: Past Medical History:  Diagnosis Date  . Alzheimer disease   . Melanotic neuroectodermal tumor   . Reflux   . Sexual dysfunction    Corrected  . Wears hearing aid     MEDICATIONS:  Outpatient Encounter Medications as of 08/03/2017  Medication Sig Note  . aspirin 325 MG tablet Take 325 mg by mouth daily.   . Cyanocobalamin (VITAMIN B-12 PO) Take 5,000 mcg by mouth daily.    Marland Kitchen DEXILANT 60 MG capsule Take 1 capsule by mouth daily. 02/13/2014: Received from: External Pharmacy  . donepezil (ARICEPT) 10 MG tablet Take 1 tablet (10 mg total) by mouth daily.   Marland Kitchen escitalopram (LEXAPRO) 5 MG tablet Take 1 tablet (5 mg total) by mouth daily.   Marland Kitchen HYDROcodone-acetaminophen ([facility]) 10-325 MG tablet Take 1 tablet by mouth every 4 (four) hours as needed.   . Loratadine (ALAVERT PO) Take by mouth daily.   . memantine (NAMENDA) 10 MG tablet Take 1 tablet twice a day   . olopatadine (PATANOL) 0.1 %  ophthalmic solution INSTILL 1 DROP INTO BOTH EYES ONCE DAILY AS DIRECTED 11/13/2014: Received from: External Pharmacy  . traZODone (DESYREL) 100 MG tablet 100 mg. Take 1 tablet at bedtime 11/13/2014: Received from: External Pharmacy Received Sig:   . VIAGRA 100 MG tablet Take 100 mg by mouth as needed for erectile dysfunction.     No facility-administered encounter medications on file as of 08/03/2017.     ALLERGIES: Allergies  Allergen Reactions  . Amoxicillin     Bothers Stomach    FAMILY HISTORY: No family history on file.  SOCIAL HISTORY: Social History   Socioeconomic History  . Marital status: Married    Spouse name: Not on file  . Number of children: Not on file  . Years of education: Not on file  . Highest education level: Not on file  Occupational History  . Not on file  Social Needs  . Financial resource strain: Not on file  . Food insecurity:    Worry: Not on file    Inability: Not on file  . Transportation needs:    Medical: Not on file    Non-medical: Not on file  Tobacco Use  . Smoking status: Never Smoker  . Smokeless tobacco: Never Used  Substance and Sexual Activity  . Alcohol use: Yes  Alcohol/week: 0.0 oz    Comment: 3 Beers per Day  . Drug use: No  . Sexual activity: Not on file  Lifestyle  . Physical activity:    Days per week: Not on file    Minutes per session: Not on file  . Stress: Not on file  Relationships  . Social connections:    Talks on phone: Not on file    Gets together: Not on file    Attends religious service: Not on file    Active member of club or organization: Not on file    Attends meetings of clubs or organizations: Not on file    Relationship status: Not on file  . Intimate partner violence:    Fear of current or ex partner: Not on file    Emotionally abused: Not on file    Physically abused: Not on file    Forced sexual activity: Not on file  Other Topics Concern  . Not on file  Social History Narrative  . Not  on file    REVIEW OF SYSTEMS: Constitutional: No fevers, chills, or sweats, no generalized fatigue, change in appetite Eyes: No visual changes, double vision, eye pain Ear, nose and throat: No hearing loss, ear pain, nasal congestion, sore throat Cardiovascular: No chest pain, palpitations Respiratory:  No shortness of breath at rest or with exertion, wheezes GastrointestinaI: No nausea, vomiting, diarrhea, abdominal pain, fecal incontinence Genitourinary:  No dysuria, urinary retention or frequency Musculoskeletal:  No neck pain, back pain Integumentary: No rash, pruritus, skin lesions Neurological: as above Psychiatric: No depression, insomnia, anxiety Endocrine: No palpitations, fatigue, diaphoresis, mood swings, change in appetite, change in weight, increased thirst Hematologic/Lymphatic:  No anemia, purpura, petechiae. Allergic/Immunologic: no itchy/runny eyes, nasal congestion, recent allergic reactions, rashes  PHYSICAL EXAM: Vitals:   08/03/17 0928  BP: (!) 158/82  Pulse: 72  SpO2: 98%   General: No acute distress Head:  Normocephalic/atraumatic Neck: supple, no paraspinal tenderness, full range of motion Heart:  Regular rate and rhythm Lungs:  Clear to auscultation bilaterally Back: No paraspinal tenderness Skin/Extremities: No rash, no edema Neurological Exam: alert and oriented to person, place. No aphasia or dysarthria. Fund of knowledge is reduced.  Recent and remote memory are impaired.  Attention and concentration are normal.    Able to name objects and repeat phrases. Cranial nerves: Pupils equal, round, reactive to light. Extraocular movements intact with no nystagmus. Visual fields full but seems to have some left-sided neglect at times. Facial sensation intact. No facial asymmetry. Tongue, uvula, palate midline.  Motor: Bulk and tone normal, muscle strength 5/5 throughout with no pronator drift.  Sensation to light touch intact.  No extinction to double  simultaneous stimulation.  Deep tendon reflexes 1+ throughout, toes downgoing.  Finger to nose testing intact.  Gait narrow-based and steady, able to tandem walk adequately.  Romberg negative.  IMPRESSION: This is a very pleasant 67 yo RH man with a history of back surgery, melanoma, with early onset dementia. MMSE today 11/30 in December 2018 (21/30 in December 2017, 25/30 in August 2016, 23/30 in February 2016). His wife reports continued decline, as well as more mood changes. Continue Aricept 10mg  daily and Namenda 10mg  BID, increase Lexapro to 10mg  daily. His wife is adamant about refusing assisted living, we discussed the need for 24/7 supervision at this point. She is planning to look for a 1-level house and retire soon, resources for in-home aide was given today. We again discussed the importance of controlling vascular  risk factors, physical and brain stimulation exercises for brain health. He will follow-up in 6 months and knows to call our office for any changes.  Thank you for allowing me to participate in his care.  Please do not hesitate to call for any questions or concerns.  The duration of this appointment visit was 25 minutes of face-to-face time with the patient.  Greater than 50% of this time was spent in counseling, explanation of diagnosis, planning of further management, and coordination of care.   Ellouise Newer, M.D.   CC: Dr. Moreen Fowler

## 2017-08-03 NOTE — Patient Instructions (Signed)
1. Increase Lexapro (escitalopram) to 10mg  daily 2. Continue Aricept 10mg  daily and Namenda 10mg  twice a day 3. Recommend 24/7 supervision, look into in-home care 4. Follow-up in 6 months, call for any changes  FALL PRECAUTIONS: Be cautious when walking. Scan the area for obstacles that may increase the risk of trips and falls. When getting up in the mornings, sit up at the edge of the bed for a few minutes before getting out of bed. Consider elevating the bed at the head end to avoid drop of blood pressure when getting up. Walk always in a well-lit room (use night lights in the walls). Avoid area rugs or power cords from appliances in the middle of the walkways. Use a walker or a cane if necessary and consider physical therapy for balance exercise. Get your eyesight checked regularly.  FINANCIAL OVERSIGHT: Supervision, especially oversight when making financial decisions or transactions is also recommended.  HOME SAFETY: Consider the safety of the kitchen when operating appliances like stoves, microwave oven, and blender. Consider having supervision and share cooking responsibilities until no longer able to participate in those. Accidents with firearms and other hazards in the house should be identified and addressed as well.  DRIVING: Regarding driving, in patients with progressive memory problems, driving will be impaired. We advise to have someone else do the driving if trouble finding directions or if minor accidents are reported. Independent driving assessment is available to determine safety of driving.  ABILITY TO BE LEFT ALONE: If patient is unable to contact 911 operator, consider using LifeLine, or when the need is there, arrange for someone to stay with patients. Smoking is a fire hazard, consider supervision or cessation. Risk of wandering should be assessed by caregiver and if detected at any point, supervision and safe proof recommendations should be instituted.  MEDICATION SUPERVISION:  Inability to self-administer medication needs to be constantly addressed. Implement a mechanism to ensure safe administration of the medications.  RECOMMENDATIONS FOR ALL PATIENTS WITH MEMORY PROBLEMS: 1. Continue to exercise (Recommend 30 minutes of walking everyday, or 3 hours every week) 2. Increase social interactions - continue going to [place] and enjoy social gatherings with friends and family 3. Eat healthy, avoid fried foods and eat more fruits and vegetables 4. Maintain adequate blood pressure, blood sugar, and blood cholesterol level. Reducing the risk of stroke and cardiovascular disease also helps promoting better memory. 5. Avoid stressful situations. Live a simple life and avoid aggravations. Organize your time and prepare for the next day in anticipation. 6. Sleep well, avoid any interruptions of sleep and avoid any distractions in the bedroom that may interfere with adequate sleep quality 7. Avoid sugar, avoid sweets as there is a strong link between excessive sugar intake, diabetes, and cognitive impairment The Mediterranean diet has been shown to help patients reduce the risk of progressive memory disorders and reduces cardiovascular risk. This includes eating fish, eat fruits and green leafy vegetables, nuts like almonds and hazelnuts, walnuts, and also use olive oil. Avoid fast foods and fried foods as much as possible. Avoid sweets and sugar as sugar use has been linked to worsening of memory function.  There is always a concern of gradual progression of memory problems. If this is the case, then we may need to adjust level of care according to patient needs. Support, both to the patient and caregiver, should then be put into place.

## 2017-10-16 ENCOUNTER — Other Ambulatory Visit: Payer: Self-pay

## 2017-10-16 DIAGNOSIS — F0390 Unspecified dementia without behavioral disturbance: Secondary | ICD-10-CM

## 2017-10-16 DIAGNOSIS — F03B0 Unspecified dementia, moderate, without behavioral disturbance, psychotic disturbance, mood disturbance, and anxiety: Secondary | ICD-10-CM

## 2017-10-16 MED ORDER — ESCITALOPRAM OXALATE 10 MG PO TABS: 10.0000 mg | ORAL_TABLET | Freq: Every day | ORAL | 11 refills | Status: DC

## 2017-10-16 MED ORDER — DONEPEZIL HCL 10 MG PO TABS: 10.0000 mg | ORAL_TABLET | Freq: Every day | ORAL | 11 refills | Status: DC

## 2017-11-27 DIAGNOSIS — N419 Inflammatory disease of prostate, unspecified: Secondary | ICD-10-CM | POA: Diagnosis not present

## 2017-11-27 DIAGNOSIS — R350 Frequency of micturition: Secondary | ICD-10-CM | POA: Diagnosis not present

## 2017-12-22 ENCOUNTER — Telehealth: Payer: Self-pay | Admitting: Neurology

## 2017-12-22 NOTE — Telephone Encounter (Signed)
Returned call to pt's wife.  No answer.  VM box full.  Unable to relay message below.

## 2017-12-22 NOTE — Telephone Encounter (Signed)
Patient's wife called and wanted to update Dr. Delice Lesch on some changes with Brian [name]. She said they moved about 3 weeks ago and he still will wake up in the middle of the night thinking he needs to go home but he is home. She also said that he confuses the Toilet and the Bathtub and he is using the bathroom a lot. She has had to uses her FMLA to be out with him. Please Call. Thanks

## 2017-12-22 NOTE — Telephone Encounter (Signed)
Spoke with pt's wife.  She states that they moved into a smaller home about 2 - 2 1/2  weeks ago and pt has now become more confused and combative.  He is confusing the bathtub for the commode.  Pt wife has old Rx of Trazodone 100mg  tabs and has given pt 1/2 tab for the last 3 nights.  States that this has helped him rest more.  Pt wife asked if the moved could possible have anything to do with his state of confusion. I let her know that with his Dx, the move most likely is scary and confusing to pt  - especially since they resided in their previous home for 17 years.  Advised I would send message to Dr. Delice Lesch and return call with any recommendations.

## 2017-12-22 NOTE — Telephone Encounter (Signed)
Agree that change in routine is hard at the beginning, continue with establishing new routine, it may take several more weeks. Start giving trazodone 100mg  1/2 tablet every night for a week, can increase to 1 tablet every night. Thanks

## 2017-12-23 MED ORDER — TRAZODONE HCL 100 MG PO TABS: ORAL_TABLET | ORAL | 5 refills | Status: DC

## 2017-12-23 NOTE — Telephone Encounter (Signed)
Spoke withpt's wife relaying message below.  Rx sent to [facility] on [address].

## 2017-12-23 NOTE — Addendum Note (Signed)
Addended by: Lenny Pastel on: 12/23/2017 01:26 PM   Modules accepted: Orders

## 2018-01-21 ENCOUNTER — Telehealth: Payer: Self-pay | Admitting: Neurology

## 2018-01-21 NOTE — Telephone Encounter (Signed)
Patient's wife said she faxed in [place] paperwork on her husband and was calling to check the status of that. Please call her back at [phone]. Thanks!

## 2018-01-22 DIAGNOSIS — Z029 Encounter for administrative examinations, unspecified: Secondary | ICD-10-CM

## 2018-01-22 NOTE — Telephone Encounter (Signed)
Paperwork at the front, looks like needs payment. I called patient's wife and made her aware. Transferred to front desk to make the payment over the phone.

## 2018-01-22 NOTE — Telephone Encounter (Signed)
Patient's wife called needing her FMLA paperwork ASAP for this patient. Thanks!

## 2018-03-05 ENCOUNTER — Encounter: Payer: Self-pay | Admitting: Neurology

## 2018-03-05 ENCOUNTER — Ambulatory Visit: Payer: Medicare Other | Admitting: Neurology

## 2018-03-05 ENCOUNTER — Ambulatory Visit (INDEPENDENT_AMBULATORY_CARE_PROVIDER_SITE_OTHER): Payer: Medicare Other | Admitting: Neurology

## 2018-03-05 ENCOUNTER — Other Ambulatory Visit: Payer: Self-pay

## 2018-03-05 VITALS — BP 142/80 | HR 62 | Ht 73.0 in | Wt 193.0 lb

## 2018-03-05 DIAGNOSIS — F0391 Unspecified dementia with behavioral disturbance: Secondary | ICD-10-CM

## 2018-03-05 DIAGNOSIS — F03B18 Unspecified dementia, moderate, with other behavioral disturbance: Secondary | ICD-10-CM

## 2018-03-05 MED ORDER — TRAZODONE HCL 100 MG PO TABS: ORAL_TABLET | ORAL | 3 refills | Status: AC

## 2018-03-05 MED ORDER — MEMANTINE HCL 10 MG PO TABS: ORAL_TABLET | ORAL | 3 refills | Status: AC

## 2018-03-05 MED ORDER — ESCITALOPRAM OXALATE 10 MG PO TABS: 10.0000 mg | ORAL_TABLET | Freq: Every day | ORAL | 3 refills | Status: DC

## 2018-03-05 MED ORDER — DONEPEZIL HCL 10 MG PO TABS: 10.0000 mg | ORAL_TABLET | Freq: Every day | ORAL | 3 refills | Status: AC

## 2018-03-05 MED ORDER — ESCITALOPRAM OXALATE 20 MG PO TABS: 20.0000 mg | ORAL_TABLET | Freq: Every day | ORAL | 3 refills | Status: AC

## 2018-03-05 NOTE — Patient Instructions (Signed)
1. Increase Lexapro to 20mg  daily 2. Continue all your other medications 3. 24/7 supervision at all times at this point 4. Follow-up in 6 months, call for any changes  FALL PRECAUTIONS: Be cautious when walking. Scan the area for obstacles that may increase the risk of trips and falls. When getting up in the mornings, sit up at the edge of the bed for a few minutes before getting out of bed. Consider elevating the bed at the head end to avoid drop of blood pressure when getting up. Walk always in a well-lit room (use night lights in the walls). Avoid area rugs or power cords from appliances in the middle of the walkways. Use a walker or a cane if necessary and consider physical therapy for balance exercise. Get your eyesight checked regularly.  HOME SAFETY: Consider the safety of the kitchen when operating appliances like stoves, microwave oven, and blender. Consider having supervision and share cooking responsibilities until no longer able to participate in those. Accidents with firearms and other hazards in the house should be identified and addressed as well.  ABILITY TO BE LEFT ALONE: If patient is unable to contact 911 operator, consider using LifeLine, or when the need is there, arrange for someone to stay with patients. Smoking is a fire hazard, consider supervision or cessation. Risk of wandering should be assessed by caregiver and if detected at any point, supervision and safe proof recommendations should be instituted.  RECOMMENDATIONS FOR ALL PATIENTS WITH MEMORY PROBLEMS: 1. Continue to exercise (Recommend 30 minutes of walking everyday, or 3 hours every week) 2. Increase social interactions - continue going to [place] and enjoy social gatherings with friends and family 3. Eat healthy, avoid fried foods and eat more fruits and vegetables 4. Maintain adequate blood pressure, blood sugar, and blood cholesterol level. Reducing the risk of stroke and cardiovascular disease also helps promoting  better memory. 5. Avoid stressful situations. Live a simple life and avoid aggravations. Organize your time and prepare for the next day in anticipation. 6. Sleep well, avoid any interruptions of sleep and avoid any distractions in the bedroom that may interfere with adequate sleep quality 7. Avoid sugar, avoid sweets as there is a strong link between excessive sugar intake, diabetes, and cognitive impairment The Mediterranean diet has been shown to help patients reduce the risk of progressive memory disorders and reduces cardiovascular risk. This includes eating fish, eat fruits and green leafy vegetables, nuts like almonds and hazelnuts, walnuts, and also use olive oil. Avoid fast foods and fried foods as much as possible. Avoid sweets and sugar as sugar use has been linked to worsening of memory function.  There is always a concern of gradual progression of memory problems. If this is the case, then we may need to adjust level of care according to patient needs. Support, both to the patient and caregiver, should then be put into place.
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
## 2018-03-15 ENCOUNTER — Ambulatory Visit: Payer: Medicare Other | Admitting: Neurology

## 2018-04-05 DIAGNOSIS — N3281 Overactive bladder: Secondary | ICD-10-CM | POA: Diagnosis not present

## 2018-04-05 DIAGNOSIS — N3941 Urge incontinence: Secondary | ICD-10-CM | POA: Diagnosis not present

## 2018-05-12 DIAGNOSIS — R351 Nocturia: Secondary | ICD-10-CM | POA: Diagnosis not present

## 2018-05-12 DIAGNOSIS — N3941 Urge incontinence: Secondary | ICD-10-CM | POA: Diagnosis not present

## 2018-05-12 DIAGNOSIS — N401 Enlarged prostate with lower urinary tract symptoms: Secondary | ICD-10-CM | POA: Diagnosis not present

## 2018-06-09 DIAGNOSIS — R351 Nocturia: Secondary | ICD-10-CM | POA: Diagnosis not present

## 2018-06-09 DIAGNOSIS — N3941 Urge incontinence: Secondary | ICD-10-CM | POA: Diagnosis not present

## 2018-06-09 DIAGNOSIS — R8271 Bacteriuria: Secondary | ICD-10-CM | POA: Diagnosis not present

## 2018-06-09 DIAGNOSIS — N401 Enlarged prostate with lower urinary tract symptoms: Secondary | ICD-10-CM | POA: Diagnosis not present

## 2018-06-24 DIAGNOSIS — N401 Enlarged prostate with lower urinary tract symptoms: Secondary | ICD-10-CM | POA: Diagnosis not present

## 2018-06-24 DIAGNOSIS — N311 Reflex neuropathic bladder, not elsewhere classified: Secondary | ICD-10-CM | POA: Diagnosis not present

## 2018-06-24 DIAGNOSIS — N3941 Urge incontinence: Secondary | ICD-10-CM | POA: Diagnosis not present

## 2018-06-24 DIAGNOSIS — R3915 Urgency of urination: Secondary | ICD-10-CM | POA: Diagnosis not present

## 2018-09-02 ENCOUNTER — Telehealth: Payer: Self-pay | Admitting: Neurology

## 2018-09-02 NOTE — Telephone Encounter (Signed)
Patient wife state patient is having with sleeping and also she states that there are time he does not want to take showers etc please call

## 2018-09-03 MED ORDER — DIVALPROEX SODIUM ER 250 MG PO TB24: 250.0000 mg | ORAL_TABLET | Freq: Every day | ORAL | 3 refills | Status: AC

## 2018-09-03 NOTE — Telephone Encounter (Signed)
Agree with sending the informational packet on how to help deal with behaviors. Pls let her know that we can add on low dose Depakote ER 250mg  every night, this is not a sleeping pill but can help with the behaviors and side effect can be drowsiness. We can increase dose later if he is not having side effects. If she is agreeable, pls send in Rx for Depakote ER 250mg  qhs. Thanks!  Spoke with Pt wife Malachy Mood she wants to try Depakote ER 250mg  qhs it was sent to [facility] on [address], information packet was placed in the mail, pt has a followup appointment in July

## 2018-09-03 NOTE — Telephone Encounter (Signed)
Agree with sending the informational packet on how to help deal with behaviors. Pls let her know that we can add on low dose Depakote ER 250mg  every night, this is not a sleeping pill but can help with the behaviors and side effect can be drowsiness. We can increase dose later if he is not having side effects. If she is agreeable, pls send in Rx for Depakote ER 250mg  qhs. Thanks!

## 2018-09-03 NOTE — Telephone Encounter (Signed)
Spoke with pt wife, he is having a change in sleep pattern, he is increase restlessness, up walking around even after sleeping pill, hard to get him to take a shower, he is hallucinating and not seeing things that he is looking, wife said its like he is looking at it but cant see it, example the cat can be sitting on his lap and he will ask where the cat is. I can mail her an information packet if you would like? She is also asking about something different maybe to help with sleep. The hallucinations at this time are not bothering him

## 2018-09-10 ENCOUNTER — Telehealth: Payer: Self-pay
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
## 2018-09-24 ENCOUNTER — Other Ambulatory Visit (HOSPITAL_COMMUNITY): Payer: Self-pay | Admitting: Family Medicine

## 2018-09-24 ENCOUNTER — Encounter (HOSPITAL_COMMUNITY): Payer: Self-pay

## 2018-09-24 ENCOUNTER — Ambulatory Visit (HOSPITAL_COMMUNITY)
Admission: RE | Admit: 2018-09-24 | Discharge: 2018-09-24 | Disposition: A | Discharge status: Home / Self Care | Payer: Medicare HMO | Source: Ambulatory Visit | Attending: Family Medicine | Admitting: Family Medicine

## 2018-09-24 ENCOUNTER — Other Ambulatory Visit: Payer: Self-pay

## 2018-09-24 DIAGNOSIS — G309 Alzheimer's disease, unspecified: Secondary | ICD-10-CM | POA: Diagnosis not present

## 2018-09-24 DIAGNOSIS — R634 Abnormal weight loss: Secondary | ICD-10-CM | POA: Diagnosis not present

## 2018-09-24 DIAGNOSIS — M79605 Pain in left leg: Secondary | ICD-10-CM

## 2018-09-24 DIAGNOSIS — M7989 Other specified soft tissue disorders: Secondary | ICD-10-CM

## 2018-09-24 DIAGNOSIS — R600 Localized edema: Secondary | ICD-10-CM | POA: Diagnosis not present

## 2018-09-24 NOTE — Progress Notes (Signed)
Attempted to perform a left lower extremity venous duplex. Unable to perform exam due to patient's mental state due to dementia. Refused to lie on the stretcher after getting him to stand up out of the chair. Attempted to get him undressed while standing and he continued to redress himself. He finally sat down on the stretcher became quite confused and wanted to go home. We were finally  able to get him back in the chair and the wife wanted to take him to the bathroom which became another ordeal. Both the wife and the patient were very nice but we were unable to complete the test. Dr. Olen Pel was called since he was on call and I was told that he would get in touch with Dr. Moreen Fowler concerning the patient. Brian [name],RVS 09/24/2018, 5:51 PM

## 2018-09-27 ENCOUNTER — Other Ambulatory Visit: Payer: Self-pay

## 2018-09-27 ENCOUNTER — Telehealth: Payer: Self-pay | Admitting: Neurology

## 2018-09-27 ENCOUNTER — Ambulatory Visit: Payer: Medicare Other | Admitting: Neurology

## 2018-09-27 DIAGNOSIS — R410 Disorientation, unspecified: Secondary | ICD-10-CM | POA: Diagnosis not present

## 2018-09-27 DIAGNOSIS — R600 Localized edema: Secondary | ICD-10-CM | POA: Diagnosis not present

## 2018-09-27 DIAGNOSIS — S99922A Unspecified injury of left foot, initial encounter: Secondary | ICD-10-CM | POA: Diagnosis not present

## 2018-09-27 DIAGNOSIS — F0390 Unspecified dementia without behavioral disturbance: Secondary | ICD-10-CM | POA: Diagnosis not present

## 2018-09-27 DIAGNOSIS — M7989 Other specified soft tissue disorders: Secondary | ICD-10-CM | POA: Diagnosis not present

## 2018-09-27 DIAGNOSIS — R609 Edema, unspecified: Secondary | ICD-10-CM | POA: Diagnosis not present

## 2018-09-27 DIAGNOSIS — S81812A Laceration without foreign body, left lower leg, initial encounter: Secondary | ICD-10-CM | POA: Diagnosis not present

## 2018-09-27 DIAGNOSIS — W19XXXA Unspecified fall, initial encounter: Secondary | ICD-10-CM | POA: Diagnosis not present

## 2018-09-27 DIAGNOSIS — M79662 Pain in left lower leg: Secondary | ICD-10-CM | POA: Diagnosis not present

## 2018-09-27 DIAGNOSIS — F419 Anxiety disorder, unspecified: Secondary | ICD-10-CM | POA: Diagnosis not present

## 2018-09-27 MED ORDER — ARIPIPRAZOLE 5 MG PO TABS: ORAL_TABLET | ORAL | 1 refills | Status: DC

## 2018-09-27 MED ORDER — ARIPIPRAZOLE 5 MG PO TABS: ORAL_TABLET | ORAL | 1 refills | Status: AC

## 2018-09-27 NOTE — Telephone Encounter (Signed)
Wife started out giving pt Depakote at night to help him sleep. She seen that wasn't helping so switch to 1 in the morning with no changes.  Pt fell about 2 weeks ago in shower. Since then pt refuses to shower, change clothes, or leave the house.  Wife called PCP, Dr. Rockwell Germany and a u/s was suggested to r/o blood cot. Wife got the pt to [facility] and pt refused tx. Per Dr. Rockwell Germany pt will need to go to ER and be sedated.  Wife states that pt is up most nights wondering, talking, doesn't know where he is. Pt is agitated.

## 2018-09-27 NOTE — Telephone Encounter (Signed)
Pharm is [facility] in [place]. Thanks!

## 2018-09-27 NOTE — Telephone Encounter (Signed)
Wife called and wanted to let you know. He is more agitated and irritated after he takes the new meds. He wont shower/ take a bath or change clothes. Left foot is swollen almost 2xs the size of right foot. She is having a hard time to do anything. She could not get him into the car for the appt today. Please call her back. Thanks!

## 2018-09-27 NOTE — Telephone Encounter (Signed)
I really think she needs more help at home. Does she want to start a different medication to help potentially calm him down? Main side effect would be drowsiness so take at night. It's a pretty strong medication, if she would like to start Abilify, take 5mg  1/2 tab qhs and see how he does. We can increase if needed. Thanks

## 2018-09-27 NOTE — Telephone Encounter (Signed)
Wife said that she was returning a call to [place]. Thanks!

## 2018-09-27 NOTE — Telephone Encounter (Signed)
Abilify sent to [facility] in [place] since closer for pt. Wife states that she is going to have to call EMS to take pt to ER to evaluate leg for possible blood clot per Dr. Rockwell Germany.

## 2018-09-27 NOTE — Telephone Encounter (Signed)
Abilify sent to [facility] in [place].

## 2018-09-29 ENCOUNTER — Telehealth: Payer: Self-pay | Admitting: Neurology

## 2018-09-29 NOTE — Telephone Encounter (Signed)
[facility] is calling in about the PA denial for the abilify medication. Thanks!

## 2018-10-05 ENCOUNTER — Ambulatory Visit: Payer: PRIVATE HEALTH INSURANCE | Admitting: Neurology

## 2018-10-05 ENCOUNTER — Telehealth: Payer: Self-pay | Admitting: Neurology

## 2018-10-05 DIAGNOSIS — F0391 Unspecified dementia with behavioral disturbance: Secondary | ICD-10-CM

## 2018-10-05 DIAGNOSIS — F03B18 Unspecified dementia, moderate, with other behavioral disturbance: Secondary | ICD-10-CM

## 2018-10-05 NOTE — Telephone Encounter (Signed)
Wife is calling in about insurance denial of the Abilify medication. She was wondering about that. She also said he is still refusing to shower and change his clothes. Thanks!

## 2018-10-06 NOTE — Telephone Encounter (Signed)
She can try going to a different pharmacy (maybe [facility]) with a GoodRx card, sometimes the price would be cheaper paying out of pocket instead of with insurance. She would still need help at home even if she stays home with him, I have discussed this with her several times, please make sure she has what she needs (resources) once she agrees to have someone come in. Thanks

## 2018-10-06 NOTE — Telephone Encounter (Signed)
Spoke to patient's wife. Regarding the denial of Abilify I told her I will start the prior authorization process. She also stated he is refusing to shower/change clothes. Also, he is urinating in different places around the house (bedroom carpet, garbage can in kitchen, pantry floor). When she takes him in the bathroom and shows him the toilet she doesn't think he can understand that is where he needs to go. Wife is having her last day of work this Friday (has been working limited hours) and will be home with patient all the time after then. Any recommendations to pass on to wife?

## 2018-10-08 NOTE — Telephone Encounter (Signed)
Completed "Cover my meds" to request prior authorization for Abilify and submitted on line.

## 2018-10-08 NOTE — Telephone Encounter (Signed)
Attempted to reach wife to tell her about Good Rx discount card if Abilify is not approved. Also have list of resources that may be useful for patient's wife as far as receiving help at home with her husband. Left message with wife that I will call back next week when have a response from "cover my meds" regarding Abilify. At that time will mail out this information (Good Rx/[employer]).

## 2018-10-11 NOTE — Telephone Encounter (Signed)
Called [facility] in [place] and let them know the cover my meds paperwork has been submitted and hopefully will hear back soon. At that time will call Brian [name] back and make them aware of approval status.

## 2018-10-11 NOTE — Telephone Encounter (Signed)
Pharm called again and left msg with after hours about PA.

## 2018-10-12 ENCOUNTER — Telehealth: Payer: Self-pay

## 2018-10-12 NOTE — Telephone Encounter (Signed)
Received fax from [facility] to confirm pts diagnosis for Abilify. Faxed OV notes to [facility]. [phone].  Also received fax from [facility] of a denial for Abilify and if an appeal was needed that we would need to complete the required forms. Forms completed and faxed OV notes to [phone]

## 2018-10-14 ENCOUNTER — Telehealth: Payer: Self-pay

## 2018-10-14 NOTE — Telephone Encounter (Signed)
Abilify 5mg  has been approved from 10/14/18- 03/17/19.  Reference number [id number]  Pharmacy informed.

## 2018-10-14 NOTE — Telephone Encounter (Signed)
Called wife and informed her this office received a denial for Abilify and information resubmitted yesterday (see prior encounter dated 7/29).    She also discussed wanting any information on resources available to help her with patient at home. She has already received our folder with resource info. I will send more resources to her via mail and email that may be helpful to her.   As he had to cancel last appt I recommended they go ahead and call to make a future appt with Dr. Delice Lesch. Wife agreed and will do so.

## 2018-10-18 NOTE — Addendum Note (Signed)
Addended by: Jesse Fall on: 10/18/2018 08:36 AM   Modules accepted: Orders

## 2018-10-18 NOTE — Telephone Encounter (Signed)
Spoke to patient's wife on Friday July 31 regarding resources available to her for caring for her husband at home. Information mailed and emailed to her. She is also interested in ST for cognition, HH Aide to assist with bathing (patient scared of water and will not let wife get him in the shower), and social work to discuss options and possible future placement. Wife is interested in Olivia coming to do an assessment at their home. Order obtained from Dr. Delice Lesch and [place] made aware.

## 2018-10-18 NOTE — Addendum Note (Signed)
Addended by: Jesse Fall on: 10/18/2018 11:26 AM   Modules accepted: Orders

## 2018-10-19 ENCOUNTER — Telehealth: Payer: Self-pay | Admitting: *Deleted

## 2018-10-19 NOTE — Telephone Encounter (Signed)
Disregard opened in error °

## 2018-10-19 NOTE — Telephone Encounter (Addendum)
Patient needed to reschedule 6 mos appt as was unable to come last month as wife was unable to get him in the car. [facility] orders put in yesterday (see telephone note under 7/21) but patient has to have been seen by MD within 90 days prior ( these therapies will be able to be started after the virtual visit with Dr. Delice Lesch 8/18 at 0830).  Per last appt note patient needed MMSE on next visit (copied from last appt notes for this to be done this coming appt). Ipad # to call is [phone]. Spoke with wife Malachy Mood on phone regarding this and she is aware of appt.

## 2018-11-02 ENCOUNTER — Other Ambulatory Visit: Payer: Self-pay

## 2018-11-02 ENCOUNTER — Encounter: Payer: Self-pay | Admitting: Neurology

## 2018-11-02 ENCOUNTER — Telehealth (INDEPENDENT_AMBULATORY_CARE_PROVIDER_SITE_OTHER): Payer: Medicare HMO | Admitting: Neurology

## 2018-11-02 ENCOUNTER — Telehealth: Payer: Self-pay | Admitting: *Deleted

## 2018-11-02 DIAGNOSIS — F0390 Unspecified dementia without behavioral disturbance: Secondary | ICD-10-CM

## 2018-11-02 DIAGNOSIS — F03C0 Unspecified dementia, severe, without behavioral disturbance, psychotic disturbance, mood disturbance, and anxiety: Secondary | ICD-10-CM
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
## 2018-11-02 NOTE — Telephone Encounter (Signed)
Contacted Cori Razor [phone]) liason for [facility] to make her aware patient had his virtual MD visit today. Therapies were not able to be started until he had a recent appt so I called to make her aware appt is complete therefore therapies may begin. Orders for ST/HHA/SW through [facility] placed on 8/3 (under 7/21 telephone encounter). Cecille Rubin will contact me if any more information needed.

## 2018-11-08 ENCOUNTER — Telehealth: Payer: Self-pay | Admitting: Neurology

## 2018-11-08 NOTE — Telephone Encounter (Signed)
Wife is calling in that patient went to the bathroom all over himself and clothes. He has gotten it all over the house/bed/walls. This being #2. He would not get his clothes off and get in the shower. She is having a hard time handling him. He continues to walk all over the house making a huge mess and she can't get him under control. Please call her back. Thanks!

## 2018-11-09 ENCOUNTER — Telehealth: Payer: Self-pay | Admitting: Neurology

## 2018-11-09 NOTE — Telephone Encounter (Signed)
Wife informed. She will start giving Depakote at 5pm. Continue giving 1/2 Abilify. Then will give Trazodone at bedtime. She will give meds 1-2 weeks and then call if Depakote does not help mood by giving at night she will call.

## 2018-11-09 NOTE — Telephone Encounter (Signed)
Sonia Baller from [facility] is calling in about the verbal orders on this patient: 1 week for 3 weeks Speech Therapy. Thanks!

## 2018-11-09 NOTE — Telephone Encounter (Signed)
The afternoon agitation is likely due to sundowning and not medication. Pls have her give the Depakote and Abilify at 5pm, then Trazodone at bedtime and see if this helps better.

## 2018-11-09 NOTE — Telephone Encounter (Signed)
Spoke with pts wife, Malachy Mood. She does not know what triggered the episode yesterday. Pt walked around the house for about 3 house making a mess. Out of the blue he calmed down and apologized later on.  Wife questioned if Depakote may be causing. She gives him the med in the morning. Then by the afternoon he is agitated and angry about everything.  She would also like to mention that she has been giving him 1/2 tab of Abilify with with Trazodone nightly. Sometimes it works sometimes it does not. However the pt wakes up groggy???

## 2018-11-11 NOTE — Telephone Encounter (Signed)
Noted, thanks!

## 2018-11-11 NOTE — Telephone Encounter (Signed)
Spoke with Sonia Baller.  She visited Mr. Bacher yesterday. Pts wife was given information on Hospice as well as other facilties outside of the home to help with care. A lot depends on insurance and what the wife wishes to do.  Sonia Baller wanted me to make Dr. Delice Lesch aware of all.

## 2018-11-12 DIAGNOSIS — F0391 Unspecified dementia with behavioral disturbance: Secondary | ICD-10-CM | POA: Diagnosis not present

## 2018-11-12 DIAGNOSIS — R609 Edema, unspecified: Secondary | ICD-10-CM | POA: Diagnosis not present

## 2018-11-12 DIAGNOSIS — R4182 Altered mental status, unspecified: Secondary | ICD-10-CM | POA: Diagnosis not present

## 2018-11-12 DIAGNOSIS — F28 Other psychotic disorder not due to a substance or known physiological condition: Secondary | ICD-10-CM | POA: Diagnosis not present

## 2018-11-12 DIAGNOSIS — R404 Transient alteration of awareness: Secondary | ICD-10-CM | POA: Diagnosis not present

## 2018-11-12 DIAGNOSIS — F068 Other specified mental disorders due to known physiological condition: Secondary | ICD-10-CM | POA: Diagnosis not present

## 2018-11-12 DIAGNOSIS — R52 Pain, unspecified: Secondary | ICD-10-CM | POA: Diagnosis not present

## 2018-11-12 DIAGNOSIS — R456 Violent behavior: Secondary | ICD-10-CM | POA: Diagnosis not present

## 2018-11-15 ENCOUNTER — Telehealth: Payer: Self-pay | Admitting: Neurology
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
## 2018-11-29 ENCOUNTER — Encounter (HOSPITAL_COMMUNITY): Payer: Self-pay

## 2018-11-29 ENCOUNTER — Emergency Department (HOSPITAL_COMMUNITY)
Admission: EM | Admit: 2018-11-29 | Discharge: 2018-11-30 | Disposition: A | Discharge status: Home / Self Care | Payer: Medicare HMO | Attending: Emergency Medicine | Admitting: Emergency Medicine

## 2018-11-29 ENCOUNTER — Telehealth: Payer: Self-pay | Admitting: Neurology

## 2018-11-29 ENCOUNTER — Emergency Department (HOSPITAL_COMMUNITY): Payer: Medicare HMO

## 2018-11-29 DIAGNOSIS — R410 Disorientation, unspecified: Secondary | ICD-10-CM | POA: Diagnosis not present

## 2018-11-29 DIAGNOSIS — Z79899 Other long term (current) drug therapy: Secondary | ICD-10-CM | POA: Diagnosis not present

## 2018-11-29 DIAGNOSIS — F0390 Unspecified dementia without behavioral disturbance: Secondary | ICD-10-CM | POA: Insufficient documentation

## 2018-11-29 DIAGNOSIS — Z20828 Contact with and (suspected) exposure to other viral communicable diseases: Secondary | ICD-10-CM | POA: Insufficient documentation

## 2018-11-29 DIAGNOSIS — R456 Violent behavior: Secondary | ICD-10-CM | POA: Diagnosis not present

## 2018-11-29 DIAGNOSIS — S0990XA Unspecified injury of head, initial encounter: Secondary | ICD-10-CM | POA: Diagnosis not present

## 2018-11-29 DIAGNOSIS — G309 Alzheimer's disease, unspecified: Secondary | ICD-10-CM | POA: Diagnosis not present

## 2018-11-29 DIAGNOSIS — Z03818 Encounter for observation for suspected exposure to other biological agents ruled out: Secondary | ICD-10-CM | POA: Diagnosis not present

## 2018-11-29 DIAGNOSIS — W19XXXA Unspecified fall, initial encounter: Secondary | ICD-10-CM | POA: Diagnosis not present

## 2018-11-29 DIAGNOSIS — R4182 Altered mental status, unspecified: Secondary | ICD-10-CM | POA: Diagnosis present

## 2018-11-29 LAB — CBC WITH DIFFERENTIAL/PLATELET
Abs Immature Granulocytes: 0.02 10*3/uL (ref 0.00–0.07)
Basophils Absolute: 0.1 10*3/uL (ref 0.0–0.1)
Basophils Relative: 1 %
Eosinophils Absolute: 0.2 10*3/uL (ref 0.0–0.5)
Eosinophils Relative: 2 %
HCT: 45.5 % (ref 39.0–52.0)
Hemoglobin: 15.1 g/dL (ref 13.0–17.0)
Immature Granulocytes: 0 %
Lymphocytes Relative: 20 %
Lymphs Abs: 1.7 10*3/uL (ref 0.7–4.0)
MCH: 29.5 pg (ref 26.0–34.0)
MCHC: 33.2 g/dL (ref 30.0–36.0)
MCV: 89 fL (ref 80.0–100.0)
Monocytes Absolute: 0.6 10*3/uL (ref 0.1–1.0)
Monocytes Relative: 7 %
Neutro Abs: 5.7 10*3/uL (ref 1.7–7.7)
Neutrophils Relative %: 70 %
Platelets: 285 10*3/uL (ref 150–400)
RBC: 5.11 MIL/uL (ref 4.22–5.81)
RDW: 12.7 % (ref 11.5–15.5)
WBC: 8.2 10*3/uL (ref 4.0–10.5)
nRBC: 0 % (ref 0.0–0.2)

## 2018-11-29 LAB — URINALYSIS, ROUTINE W REFLEX MICROSCOPIC
Bilirubin Urine: NEGATIVE
Glucose, UA: NEGATIVE mg/dL
Hgb urine dipstick: NEGATIVE
Ketones, ur: NEGATIVE mg/dL
Leukocytes,Ua: NEGATIVE
Nitrite: NEGATIVE
Protein, ur: NEGATIVE mg/dL
Specific Gravity, Urine: 1.004 — ABNORMAL LOW (ref 1.005–1.030)
pH: 6 (ref 5.0–8.0)

## 2018-11-29 LAB — COMPREHENSIVE METABOLIC PANEL
ALT: 19 U/L (ref 0–44)
AST: 20 U/L (ref 15–41)
Albumin: 3.4 g/dL — ABNORMAL LOW (ref 3.5–5.0)
Alkaline Phosphatase: 52 U/L (ref 38–126)
Anion gap: 11 (ref 5–15)
BUN: 12 mg/dL (ref 8–23)
CO2: 29 mmol/L (ref 22–32)
Calcium: 9.4 mg/dL (ref 8.9–10.3)
Chloride: 101 mmol/L (ref 98–111)
Creatinine, Ser: 0.74 mg/dL (ref 0.61–1.24)
GFR calc Af Amer: >60 mL/min (ref >60)
GFR calc non Af Amer: >60 mL/min (ref >60)
Glucose, Bld: 101 mg/dL — ABNORMAL HIGH (ref 70–99)
Potassium: 4.3 mmol/L (ref 3.5–5.1)
Sodium: 141 mmol/L (ref 135–145)
Total Bilirubin: 0.8 mg/dL (ref 0.3–1.2)
Total Protein: 6.6 g/dL (ref 6.5–8.1)

## 2018-11-29 LAB — SARS CORONAVIRUS 2 BY RT PCR (HOSPITAL ORDER, PERFORMED IN ~~LOC~~ HOSPITAL LAB): SARS Coronavirus 2: NEGATIVE

## 2018-11-29 LAB — VALPROIC ACID LEVEL: Valproic Acid Lvl: <10 ug/mL — ABNORMAL LOW (ref 50.0–100.0)

## 2018-11-29 LAB — CK: Total CK: 198 U/L (ref 49–397)

## 2018-11-29 MED ORDER — MEMANTINE HCL 10 MG PO TABS
10.0000 mg | ORAL_TABLET | Freq: Every day | ORAL | Status: DC
  Administered 2018-11-30: 13:00:00 10 mg via ORAL
  Filled 2018-11-29: qty 1

## 2018-11-29 MED ORDER — DONEPEZIL HCL 5 MG PO TABS
10.0000 mg | ORAL_TABLET | Freq: Every day | ORAL | Status: DC
  Administered 2018-11-30: 13:00:00 10 mg via ORAL
  Filled 2018-11-29: qty 2

## 2018-11-29 MED ORDER — DIVALPROEX SODIUM ER 250 MG PO TB24
250.0000 mg | ORAL_TABLET | Freq: Every day | ORAL | Status: DC
  Administered 2018-11-30: 13:00:00 250 mg via ORAL
  Filled 2018-11-29: qty 1

## 2018-11-29 MED ORDER — HALOPERIDOL LACTATE 5 MG/ML IJ SOLN
5.0000 mg | Freq: Once | INTRAMUSCULAR | Status: AC
  Administered 2018-11-29: 5 mg via INTRAMUSCULAR
  Filled 2018-11-29: qty 1

## 2018-11-29 MED ORDER — LORAZEPAM 2 MG/ML IJ SOLN
2.0000 mg | Freq: Once | INTRAMUSCULAR | Status: AC
  Administered 2018-11-29: 2 mg via INTRAMUSCULAR
  Filled 2018-11-29: qty 1

## 2018-11-29 MED ORDER — ARIPIPRAZOLE 2 MG PO TABS: 2.0000 mg | ORAL_TABLET | Freq: Once | ORAL | Status: DC

## 2018-11-29 MED ORDER — MIRABEGRON ER 50 MG PO TB24
50.0000 mg | ORAL_TABLET | Freq: Every day | ORAL | Status: DC
  Administered 2018-11-30: 50 mg via ORAL
  Filled 2018-11-29: qty 1

## 2018-11-29 MED ORDER — ESCITALOPRAM OXALATE 10 MG PO TABS
20.0000 mg | ORAL_TABLET | Freq: Every day | ORAL | Status: DC
  Administered 2018-11-30: 20 mg via ORAL
  Filled 2018-11-29: qty 2

## 2018-11-29 MED ORDER — SODIUM CHLORIDE 0.9 % IV SOLN: INTRAVENOUS | Status: DC

## 2018-11-29 MED ORDER — TRAZODONE HCL 50 MG PO TABS: 100.0000 mg | ORAL_TABLET | Freq: Every day | ORAL | Status: DC

## 2018-11-29 MED ORDER — ASPIRIN 325 MG PO TABS: 325.0000 mg | ORAL_TABLET | Freq: Once | ORAL | Status: DC

## 2018-11-29 NOTE — ED Provider Notes (Signed)
[facility] EMERGENCY DEPARTMENT Provider Note   CSN: [id number] Arrival date & time: 11/29/18  [id number]     History   Chief Complaint Chief Complaint  Patient presents with  . Altered Mental Status    HPI Brian [name] is a 68 y.o. male.     68 year old male with history of dementia who presents from home with increasing agitation.  Patient reportedly without undergone an overnight because he would not be cooperative.  Reportedly was seen at [facility] for similar symptoms and had negative work-up and sent home.  With EMS, patient has been combative.  No further history obtainable due to this current state.     Past Medical History:  Diagnosis Date  . Alzheimer disease ([place])   . Melanotic neuroectodermal tumor   . Reflux   . Sexual dysfunction    Corrected  . Wears hearing aid     Patient Active Problem List   Diagnosis Date Noted  . Moderate dementia without behavioral disturbance ([place]) 03/04/2016  . Dementia ([place]) 11/13/2014  . Memory loss 12/12/2013  . Abnormal stress test 08/14/2011  . Chest pressure 07/15/2011    Past Surgical History:  Procedure Laterality Date  . CARDIAC CATHETERIZATION  2014  . CARDIOVASCULAR STRESS TEST  12-04-2004   EF 68%  . CARPAL TUNNEL RELEASE Left 04/12/2014   Procedure: LEFT CARPAL TUNNEL RELEASE;  Surgeon: Charlotte Crumb, MD;  Location: [place];  Service: Orthopedics;  Laterality: Left;  . COLONOSCOPY    . LUMBAR DISC SURGERY    . NEUROMA SURGERY     Disc Repair  . OPEN REDUCTION INTERNAL FIXATION (ORIF) DISTAL RADIAL FRACTURE Left 04/12/2014   Procedure: OPEN REDUCTION INTERNAL FIXATION (ORIF) LEFT DISTAL RADIAL FRACTURE;  Surgeon: Charlotte Crumb, MD;  Location: [place];  Service: Orthopedics;  Laterality: Left;  ANESTHESIA: GENERAL/AXILLARY BLOCK  . TONSILLECTOMY     October of 1969  . TONSILLECTOMY          Home Medications    Prior to Admission  medications   Medication Sig Start Date End Date Taking? Authorizing Provider  ARIPiprazole (ABILIFY) 5 MG tablet Take 1/2 tablet qhs 09/27/18   Cameron Sprang, MD  aspirin 325 MG tablet Take 325 mg by mouth every 4 (four) hours as needed.     [provider]  divalproex (DEPAKOTE ER) 250 MG 24 hr tablet Take 1 tablet (250 mg total) by mouth daily. 09/03/18   Cameron Sprang, MD  donepezil (ARICEPT) 10 MG tablet Take 1 tablet (10 mg total) by mouth daily. 03/05/18   Cameron Sprang, MD  escitalopram (LEXAPRO) 20 MG tablet Take 1 tablet (20 mg total) by mouth daily. 03/05/18   Cameron Sprang, MD  memantine [facility]) 10 MG tablet Take 1 tablet twice a day 03/05/18   Cameron Sprang, MD  MYRBETRIQ 50 MG TB24 tablet Take 50 mg by mouth daily. 10/15/18   [provider]  traZODone (DESYREL) 100 MG tablet Give 1 tablet each night at bedtime 03/05/18   Cameron Sprang, MD    Family History No family history on file.  Social History Social History   Tobacco Use  . Smoking status: Never Smoker  . Smokeless tobacco: Never Used  Substance Use Topics  . Alcohol use: Yes    Alcohol/week: 0.0 standard drinks    Comment: 3 Beers per Day  . Drug use: No     Allergies   Amoxicillin  Review of Systems Review of Systems  Unable to perform ROS: Dementia     Physical Exam Updated Vital Signs BP (!) 149/102   Pulse 94   Temp (!) 97.5 F (36.4 C) (Rectal)   Resp 18   SpO2 100%   Physical Exam Vitals signs and nursing note reviewed.  Constitutional:      General: He is not in acute distress.    Appearance: Normal appearance. He is well-developed. He is not toxic-appearing.  HENT:     Head: Normocephalic and atraumatic.  Eyes:     General: Lids are normal.     Conjunctiva/sclera: Conjunctivae normal.     Pupils: Pupils are equal, round, and reactive to light.  Neck:     Musculoskeletal: Normal range of motion and neck supple.     Thyroid: No thyroid mass.      Trachea: No tracheal deviation.  Cardiovascular:     Rate and Rhythm: Normal rate and regular rhythm.     Heart sounds: Normal heart sounds. No murmur. No gallop.   Pulmonary:     Effort: Pulmonary effort is normal. No respiratory distress.     Breath sounds: Normal breath sounds. No stridor. No decreased breath sounds, wheezing, rhonchi or rales.  Abdominal:     General: Bowel sounds are normal. There is no distension.     Palpations: Abdomen is soft.     Tenderness: There is no abdominal tenderness. There is no rebound.  Musculoskeletal: Normal range of motion.        General: No tenderness.  Skin:    General: Skin is warm and dry.     Findings: No abrasion or rash.  Neurological:     Mental Status: He is alert. He is disoriented.     GCS: GCS eye subscore is 4. GCS verbal subscore is 5. GCS motor subscore is 6.     Cranial Nerves: No cranial nerve deficit.     Sensory: No sensory deficit.     Comments: Patient moves all 4 extremities  Psychiatric:        Attention and Perception: He is inattentive.        Mood and Affect: Affect is blunt.        Speech: Speech normal.        Behavior: Behavior is aggressive.      ED Treatments / Results  Labs (all labs ordered are listed, but only abnormal results are displayed) Labs Reviewed  URINE CULTURE  CBC WITH DIFFERENTIAL/PLATELET  COMPREHENSIVE METABOLIC PANEL  URINALYSIS, ROUTINE W REFLEX MICROSCOPIC  VALPROIC ACID LEVEL    EKG None  Radiology No results found.  Procedures Procedures (including critical care time)  Medications Ordered in ED Medications  0.9 %  sodium chloride infusion (has no administration in time range)  haloperidol lactate (HALDOL) injection 5 mg (has no administration in time range)     Initial Impression / Assessment and Plan / ED Course  I have reviewed the triage vital signs and the nursing notes.  Pertinent labs & imaging results that were available during my care of the patient were  reviewed by me and considered in my medical decision making (see chart for details).        Patient here with worsening dementia as well as being combative.  Medical work-up here is reassuring.  Will likely benefit from neuropsych placement Final Clinical Impressions(s) / ED Diagnoses   Final diagnoses:  None    ED Discharge Orders    None  Lacretia Leigh, MD 11/29/18 1334

## 2018-11-29 NOTE — BH Assessment (Signed)
Tele Assessment Note   Patient Name: Brian [name] MRN: [id number] Referring Physician: Zenia [name] Location of Patient: [facility] ED Location of Provider: [place] is an 68 y.o. male presenting voluntarily to [facility] ED via EMS due to combative behavior in the home as a result of progressively worsening Alzheimer's. Patient is unable to participate in assessment due to AMS. Chart review and collateral information from his wife were utilized to complete assessment.   Per EDP: "68 year old male with history of dementia who presents from home with increasing agitation.  Patient reportedly without undergone an overnight because he would not be cooperative.  Reportedly was seen at [facility] for similar symptoms and had negative work-up and sent home.  With EMS, patient has been combative.  No further history obtainable due to this current state."  Patient was assessed at [facility] ED on 8/29. He was psychological cleared on 9/8. Patient was referred to social work department for placement but was ultimately discharged with home health. Per patient's wife, Brian [name]: Since patient's discharge over the weekend he has continued to become progressively more combative, confused, refusing food/medications, and has fallen several times in the home. She reports that this time she is unable to care for him and needs placement assistance.   Diagnosis: Alzheimer disease    Past Medical History:  Past Medical History:  Diagnosis Date  . Alzheimer disease ([place])   . Melanotic neuroectodermal tumor   . Reflux   . Sexual dysfunction    Corrected  . Wears hearing aid     Past Surgical History:  Procedure Laterality Date  . CARDIAC CATHETERIZATION  2014  . CARDIOVASCULAR STRESS TEST  12-04-2004   EF 68%  . CARPAL TUNNEL RELEASE Left 04/12/2014   Procedure: LEFT CARPAL TUNNEL RELEASE;  Surgeon: Brian Crumb, MD;  Location: [place];  Service: Orthopedics;   Laterality: Left;  . COLONOSCOPY    . LUMBAR DISC SURGERY    . NEUROMA SURGERY     Disc Repair  . OPEN REDUCTION INTERNAL FIXATION (ORIF) DISTAL RADIAL FRACTURE Left 04/12/2014   Procedure: OPEN REDUCTION INTERNAL FIXATION (ORIF) LEFT DISTAL RADIAL FRACTURE;  Surgeon: Brian Crumb, MD;  Location: [place];  Service: Orthopedics;  Laterality: Left;  ANESTHESIA: GENERAL/AXILLARY BLOCK  . TONSILLECTOMY     October of 1969  . TONSILLECTOMY      Family History: History reviewed. No pertinent family history.  Social History:  reports that he has never smoked. He has never used smokeless tobacco. He reports current alcohol use. He reports that he does not use drugs.  Additional Social History:  Alcohol / Drug Use Pain Medications: see MAR Prescriptions: see MAR Over the Counter: see MAR History of alcohol / drug use?: No history of alcohol / drug abuse  CIWA: CIWA-Ar BP: 105/80 Pulse Rate: 98 COWS:    Allergies:  Allergies  Allergen Reactions  . Amoxicillin     Bothers Stomach    Home Medications: (Not in a hospital admission)   OB/GYN Status:  No LMP for male patient.  General Assessment Data Location of Assessment: [facility] ED TTS Assessment: In system Is this a Tele or Face-to-Face Assessment?: Tele Assessment Is this an Initial Assessment or a Re-assessment for this encounter?: Initial Assessment Patient Accompanied by:: N/A Language Other than English: No Living Arrangements: (his home) What gender do you identify as?: Male Marital status: Married [place] name: Brian [name] Pregnancy Status: No Living Arrangements: Spouse/significant other Can  pt return to current living arrangement?: No Admission Status: Voluntary Is patient capable of signing voluntary admission?: No Referral Source: Self/Family/Friend Insurance type: Medicare     Crisis Care Plan Living Arrangements: Spouse/significant other Legal Guardian: (self) Name of Psychiatrist:  none Name of Therapist: none  Education Status Is patient currently in school?: No Is the patient employed, unemployed or receiving disability?: Unemployed  Risk to self with the past 6 months Suicidal Ideation: No Has patient been a risk to self within the past 6 months prior to admission? : No Suicidal Intent: No Has patient had any suicidal intent within the past 6 months prior to admission? : No Is patient at risk for suicide?: No Suicidal Plan?: No Has patient had any suicidal plan within the past 6 months prior to admission? : No Access to Means: No What has been your use of drugs/alcohol within the last 12 months?: denies Previous Attempts/Gestures: No How many times?: 0 Other Self Harm Risks: denies Triggers for Past Attempts: None known Intentional Self Injurious Behavior: None Family Suicide History: No Recent stressful life event(s): Recent negative physical changes Persecutory voices/beliefs?: No Depression: No Depression Symptoms: Feeling angry/irritable Substance abuse history and/or treatment for substance abuse?: No Suicide prevention information given to non-admitted patients: Not applicable  Risk to Others within the past 6 months Homicidal Ideation: No Does patient have any lifetime risk of violence toward others beyond the six months prior to admission? : (wife, EMS, and ED staff) Thoughts of Harm to Others: No Current Homicidal Intent: No Current Homicidal Plan: No Access to Homicidal Means: No Identified Victim: none History of harm to others?: No Assessment of Violence: On admission Violent Behavior Description: hitting, biting, grabbing arm Does patient have access to weapons?: No Criminal Charges Pending?: No Does patient have a court date: No Is patient on probation?: No  Psychosis Hallucinations: None noted Delusions: Unspecified(paranoia about TV and people in home)  Mental Status Report Appearance/Hygiene: In scrubs Eye Contact:  Poor Motor Activity: Unremarkable Speech: Unable to assess Level of Consciousness: Sedated [name]: (sedated) Affect: (sedated) Anxiety Level: None Thought Processes: Unable to Assess Judgement: Impaired Orientation: Not oriented Obsessive Compulsive Thoughts/Behaviors: Unable to Assess  Cognitive Functioning Concentration: Unable to Assess Memory: Recent Impaired, Remote Impaired Is patient IDD: No Insight: Unable to Assess Impulse Control: Unable to Assess Appetite: (UTA) Have you had any weight changes? : (UTA) Sleep: (UTA) Total Hours of Sleep: (UTA) Vegetative Symptoms: Unable to Assess  ADLScreening [facility] Assessment Services) Patient's cognitive ability adequate to safely complete daily activities?: No Patient able to express need for assistance with ADLs?: No Independently performs ADLs?: No  Prior Inpatient Therapy Prior Inpatient Therapy: No  Prior Outpatient Therapy Prior Outpatient Therapy: No Does patient have an ACCT team?: No Does patient have Intensive In-House Services?  : No Does patient have [facility] services? : No Does patient have P4CC services?: No  ADL Screening (condition at time of admission) Patient's cognitive ability adequate to safely complete daily activities?: No Is the patient deaf or have difficulty hearing?: No Does the patient have difficulty seeing, even when wearing glasses/contacts?: No Does the patient have difficulty concentrating, remembering, or making decisions?: Yes Patient able to express need for assistance with ADLs?: No Does the patient have difficulty dressing or bathing?: Yes Independently performs ADLs?: No Communication: Needs assistance Dressing (OT): Needs assistance Grooming: Needs assistance Feeding: Needs assistance Bathing: Needs assistance Toileting: Needs assistance In/Out Bed: Needs assistance Walks in Home: Needs assistance Does the patient have difficulty  walking or climbing stairs?: Yes Weakness of Legs:  (unknown)     Therapy Consults (therapy consults require a physician order) PT Evaluation Needed: No OT Evalulation Needed: No SLP Evaluation Needed: No Abuse/Neglect Assessment (Assessment to be complete while patient is alone) Abuse/Neglect Assessment Can Be Completed: Unable to assess, patient is non-responsive or altered mental status Values / Beliefs Cultural Requests During Hospitalization: None Spiritual Requests During Hospitalization: None Consults Spiritual Care Consult Needed: No Social Work Consult Needed: No Regulatory affairs officer (For Healthcare) Does Patient Have a Medical Advance Directive?: Unable to assess, patient is non-responsive or altered mental status          Disposition: Per Mordecai Maes, NP this patient does not meet in patient psych criteria. She recommends patient work with [facility] ED CSW for placement. Disposition Initial Assessment Completed for this Encounter: Yes  This service was provided via telemedicine using a 2-way, interactive audio and video technology.  Names of all persons participating in this telemedicine service and their role in this encounter. Name: Orvis Brill, LCSW Role: TTS  Name: Brian [name] Role: patient  Name: Brian [name] [name] Role: collatearl  Name:  Role:     Orvis Brill 11/29/2018 3:45 PM

## 2018-11-29 NOTE — NC FL2 (Signed)
[place] LEVEL OF CARE SCREENING TOOL     IDENTIFICATION  Patient Name: Brian [name] [id number] Birthdate: 1950/04/19 Sex: male Admission Date (Current Location): 11/29/2018  [facility] and [place] Number:  Herbalist and Address:  [facility] [place], [place] [address] [place], [place] [address]      Provider Number: [id number]  Attending Physician Name and Address:  Default, Provider, MD  Relative Name and Phone Number:  Sherrod Hatchell [facility]: [phone]    Current Level of Care: Hospital Recommended Level of Care: [place], Memory Care Prior Approval Number: [id number]  Date Approved/Denied: 11/16/18 PASRR Number: [id number]  Discharge Plan: SNF    Current Diagnoses: Patient Active Problem List   Diagnosis Date Noted  . Moderate dementia without behavioral disturbance ([place]) 03/04/2016  . Dementia ([place]) 11/13/2014  . Memory loss 12/12/2013  . Abnormal stress test 08/14/2011  . Chest pressure 07/15/2011    Orientation RESPIRATION BLADDER Height & Weight        Normal Incontinent Weight:   Height:     BEHAVIORAL SYMPTOMS/MOOD NEUROLOGICAL BOWEL NUTRITION STATUS  Dangerous to self, others or property   Incontinent    AMBULATORY STATUS COMMUNICATION OF NEEDS Skin   Limited Assist Verbally Normal                       Personal Care Assistance Level of Assistance  Bathing, Feeding, Dressing Bathing Assistance: Limited assistance Feeding assistance: Maximum assistance(pt experiences some confusion during meals and requires feeding at time) Dressing Assistance: Limited assistance     Functional Limitations Info  Sight, Hearing, Speech Sight Info: Impaired(wears glasses) Hearing Info: Impaired(Hard of Hearing) Speech Info: Adequate    SPECIAL CARE FACTORS FREQUENCY  PT (By licensed PT), OT (By licensed OT), Restorative feeding program     PT Frequency: 3x weekly OT Frequency: 3x weekly             Contractures Contractures Info: Not present    Additional Factors Info  Code Status, Allergies Code Status Info: Full Code Allergies Info: Amoxicillin           Current Medications (11/29/2018):  This is the current hospital active medication list Current Facility-Administered Medications  Medication Dose Route Frequency Provider Last Rate Last Dose  . 0.9 %  sodium chloride infusion   Intravenous Continuous Lacretia Leigh, MD   Stopped at 11/29/18 1030  . ARIPiprazole (ABILIFY) tablet 2 mg  2 mg Oral Once Lacretia Leigh, MD   Stopped at 11/29/18 1353  . aspirin tablet 325 mg  325 mg Oral Once Lacretia Leigh, MD   Stopped at 11/29/18 1354  . divalproex (DEPAKOTE ER) 24 hr tablet 250 mg  250 mg Oral Daily Lacretia Leigh, MD   Stopped at 11/29/18 1355  . donepezil (ARICEPT) tablet 10 mg  10 mg Oral Daily Lacretia Leigh, MD   Stopped at 11/29/18 1355  . escitalopram (LEXAPRO) tablet 20 mg  20 mg Oral Daily Lacretia Leigh, MD   Stopped at 11/29/18 1355  . LORazepam (ATIVAN) injection 2 mg  2 mg Intramuscular Once Veryl Speak, MD      . memantine [facility]) tablet 10 mg  10 mg Oral Daily Lacretia Leigh, MD   Stopped at 11/29/18 1354  . mirabegron ER (MYRBETRIQ) tablet 50 mg  50 mg Oral Daily Lacretia Leigh, MD   Stopped at 11/29/18 1354   Current Outpatient Medications  Medication Sig Dispense Refill  . ARIPiprazole (ABILIFY) 5  MG tablet Take 1/2 tablet qhs (Patient taking differently: Take 2.5 mg by mouth every evening. ) 30 tablet 1  . aspirin 325 MG tablet Take 325 mg by mouth every 4 (four) hours as needed for mild pain, moderate pain or headache.     . divalproex (DEPAKOTE ER) 250 MG 24 hr tablet Take 1 tablet (250 mg total) by mouth daily. (Patient taking differently: Take 250 mg by mouth every evening. ) 30 tablet 3  . donepezil (ARICEPT) 10 MG tablet Take 1 tablet (10 mg total) by mouth daily. 90 tablet 3  . escitalopram (LEXAPRO) 20 MG tablet Take 1 tablet (20 mg total) by  mouth daily. 90 tablet 3  . LORazepam (ATIVAN) 1 MG tablet Take 1 tablet by mouth 3 (three) times daily.    . memantine (NAMENDA) 10 MG tablet Take 1 tablet twice a day (Patient taking differently: Take 20 mg by mouth daily. ) 180 tablet 3  . MYRBETRIQ 50 MG TB24 tablet Take 50 mg by mouth daily.    . traZODone (DESYREL) 100 MG tablet Give 1 tablet each night at bedtime (Patient taking differently: Take 100 mg by mouth at bedtime. Give 1 tablet each night at bedtime) 90 tablet 3     Discharge Medications: Please see discharge summary for a list of discharge medications.  Relevant Imaging Results:  Relevant Lab Results:   Additional Information SS# [id number]  Anaka Beazer Dimitri Ped, LCSW

## 2018-11-29 NOTE — ED Notes (Signed)
Call back from pt wife who describes how her last few days have been at home with her husband and what all she has been through with attempting to get help for him and placement.  She states that she was unable to get him placed due to financial obstacles (lack of medicaid and facility request for either over $7000 to cover 30 days or certificate of guarantee from hospital).

## 2018-11-29 NOTE — ED Notes (Signed)
Wife states that she has a living will will and medical power of attorney for pt and is asking if she needs to bring it up here.  I advised her that this is not something that has to be brought up at this moment but that we do like to have copies of these documents on the chart and she may bring it at a time she was going to come anyways.  Pt wife Malachy Mood may be reached at [phone] if needed.

## 2018-11-29 NOTE — BHH Counselor (Signed)
Disposition: Per Mordecai Maes, NP this patient does not meet in patient psych criteria. She recommends patient work with [facility] ED CSW for placement.

## 2018-11-29 NOTE — TOC Initial Note (Signed)
Transition of Care [facility]) - Initial/Assessment Note    Patient Details  Name: Brian [name] MRN: [id number] Date of Birth: 1950/05/10  Transition of Care [facility]) CM/SW Contact:    Zenda Herskowitz Dimitri Ped, LCSW Phone Number: 11/29/2018, 10:57 PM  Clinical Narrative:      Pt is a 68 yo male with a Dx of Moderate, Dementia w/ behavioral disturbance.    CSW in contact with Pts spouse, Damauri Deuschle, [place]: [phone]. Malachy Mood explains that her goal is for pt to be placed in a "long term home where he can obtain good care".  Malachy Mood reports that pt was recently discharged from [facility] after having multiple falls and combative behavior within the home. Malachy Mood goes into detail and explains that pt refuses assistance with ADLs and hygiene. Malachy Mood expresses that the pt living at home is unsafe for the patient and unsafe for her wellbeing as well.       Malachy Mood reports that Pt has declined below his baseline recently. Pt is now incontinent of bowl and bladder, is confused while prompted to eat, wanders around the home, and is combative when prompted to engage in hygiene care. Malachy Mood also reports that pt has had difficulty ambulating since Friday 11/26/2018. Pt has a walker at home to assist with ambulation.   Family recently in contact with DSS caseworker and admissions coordinator at [facility]; [facility]: [phone]. Family is currently seeking medicaid as a result of family being unable to afford in home assistance and private pay SNF. Family is awaiting medicaid approval number. Family explains that they are expecting a call from Jettie Pagan, [place] Supervisor at [facility].   Family is interested in the following facilities for placement 1) [facility] 2) [facility] 3) [facility]  4) [facility]    CSW will complete FL2 and fax out to SNF/memory care units in [place] area.   TOC team will continue to follow pt for discharge related  needs.  [facility]itions of Care  Clinical Social Worker  Ph: [phone]   Expected Discharge Plan: Memory Care Barriers to Discharge: SNF Pending bed offer, SNF Pending Medicaid   Patient Goals and CMS Choice Patient states their goals for this hospitalization and ongoing recovery are:: Pts wife explains that their goal is for pt to find placement in a nursing home"where he can obtain good care". CMS Medicare.gov Compare Post Acute Care list provided to:: Patient Represenative (must comment)(Pts spouse, Makai [facility]ris [facility]: [phone]) Choice offered to / list presented to : Spouse  Expected Discharge Plan and Services Expected Discharge Plan: Memory Care In-house Referral: Clinical Social Work, Risk manager Acute Care Choice: Nursing Home Living arrangements for the past 2 months: Single Family Home                                      Prior Living Arrangements/Services Living arrangements for the past 2 months: Single Family Home Lives with:: Spouse Patient language and need for interpreter reviewed:: Yes Do you feel safe going back to the place where you live?: No   Pts wife does not feel equipped to handle pt at home. Pt can be combative  Need for Family Participation in Patient Care: Yes (Comment) Care giver support system in place?: Yes (comment)   Criminal Activity/Legal Involvement Pertinent to Current Situation/Hospitalization: No - Comment as needed  Activities of Daily Living  ADL Screening (condition at time of admission) Patient's cognitive ability adequate to safely complete daily activities?: No Is the patient deaf or have difficulty hearing?: No Does the patient have difficulty seeing, even when wearing glasses/contacts?: No Does the patient have difficulty concentrating, remembering, or making decisions?: Yes Patient able to express need for assistance with ADLs?: No Does the patient have difficulty dressing or  bathing?: Yes Independently performs ADLs?: No Communication: Needs assistance Dressing (OT): Needs assistance Grooming: Needs assistance Feeding: Needs assistance Bathing: Needs assistance Toileting: Needs assistance In/Out Bed: Needs assistance Walks in Home: Needs assistance Does the patient have difficulty walking or climbing stairs?: Yes Weakness of Legs: (unknown)  Permission Sought/Granted Permission sought to share information with : Family Supports, Case Manager Permission granted to share information with : Yes, Release of Information Signed  Share Information with NAME: [place] granted to share info w Relationship: Spouse  Permission granted to share info w Contact Information: PH: [phone]  Emotional Assessment   Attitude/Demeanor/Rapport: Unable to Assess Affect (typically observed): Unable to Assess   Alcohol / Substance Use: Not Applicable Psych Involvement: No (comment)  Admission diagnosis:  ams Patient Active Problem List   Diagnosis Date Noted  . Moderate dementia without behavioral disturbance ([place]) 03/04/2016  . Dementia ([place]) 11/13/2014  . Memory loss 12/12/2013  . Abnormal stress test 08/14/2011  . Chest pressure 07/15/2011   PCP:  Antony Contras, MD Pharmacy:   [id number] [place], [place] [address] [facility] [place] [address] Phone: [phone] Fax: [phone]     Social Determinants of Health (SDOH) Interventions    Readmission Risk Interventions No flowsheet data found.

## 2018-11-29 NOTE — ED Notes (Signed)
Pt moved to the hospital bed for comfort.  Pt is unable to follow any directions.  Condom cath was already pulle off by pt.  Depend soiled.  Changed depend.  Pt unable to follow directions to turn.  Fed pt his meal.  Pt was unable to feed himself at all, he appeared confused and not able to follow directions to feed himself.  As an example I buttered his roll and handed it to him but he did not seem to know what to do with this even while prompted.  When I fed it to him he seemed to have a good appetite and did eat it.

## 2018-11-29 NOTE — ED Notes (Signed)
Pt is sleeping but wakes when I speak with him.  CHecked pt depend which is dry, offered toileting.  Pt declined.  Offered to warm up pt dinner tray and help him with this, pt declines.  MOved pt to room 54 in order to be able to keep an eye on him.

## 2018-11-29 NOTE — ED Notes (Signed)
Pt wife states that she was told by staff at [facility] and they expect that his medicaid will be approved.  Wife states that they are looking at [facility] on [address] in [place] (pt wife spoke with a lady named Veryl Speak there).

## 2018-11-29 NOTE — Telephone Encounter (Signed)
Verbal orders given to [facility] for PT/OT and a Social worker from [facility]. She will fax forms. If needed can have another physician sign the orders in Dr. Lars Masson absence this week.

## 2018-11-29 NOTE — ED Notes (Signed)
Restraints removed-pt is sleeping.  COvered pt with blanket for comfort.  Will place order for safety sitter

## 2018-11-29 NOTE — ED Notes (Signed)
Help get patient undress on the monitor did ekg shown to Dr Zenia Resides patient is resting with nurse at bedside

## 2018-11-29 NOTE — ED Triage Notes (Signed)
Pt from home via PTAR; per family, pt had multiple falls yesterday, left on floor all night d/t combativeness; increased AMS, adgitation; hx dementia; combative with family, ems; recently seen at [facility] for same; baseline confusion, increase agitation since yesterday; pt recently placed on trazadone, 100 mg, pt's family only giving 50 mg because "she thinks it's too much"  136/82 HR 80 RR 18 98% RA

## 2018-11-29 NOTE — Telephone Encounter (Signed)
Sharee Pimple from [facility] Dept was calling about discharge orders for Home Health on the patient and wanting to get approval for this. Please call her back at [phone]. Thanks!

## 2018-11-30 DIAGNOSIS — I469 Cardiac arrest, cause unspecified: Secondary | ICD-10-CM | POA: Diagnosis not present

## 2018-11-30 DIAGNOSIS — Z7401 Bed confinement status: Secondary | ICD-10-CM | POA: Diagnosis not present

## 2018-11-30 DIAGNOSIS — R52 Pain, unspecified: Secondary | ICD-10-CM | POA: Diagnosis not present

## 2018-11-30 DIAGNOSIS — M2550 Pain in unspecified joint: Secondary | ICD-10-CM | POA: Diagnosis not present

## 2018-11-30 DIAGNOSIS — R410 Disorientation, unspecified: Secondary | ICD-10-CM | POA: Diagnosis not present

## 2018-11-30 LAB — URINE CULTURE: Culture: NO GROWTH

## 2018-11-30 NOTE — Evaluation (Signed)
Physical Therapy Evaluation Patient Details Name: Brian [name] MRN: [id number] DOB: [birth year] Today's Date: 11/30/2018   History of Present Illness  68yo male presenting to [facility] ED due to worsening behavior secondary to progressive Alzheimers; has had increased combativeness, agitation, and multiple falls at home. PMH Alzheimers, neuroectodermal tumor, cardiac cath, lumbar surgery, radial fracture, hx ORIF  Clinical Impression   Patient received in bed, now more awake and appearing calm, with sitter present at bedside. Cognitive status severely limited evaluation today, patient with severe Alzheimers at baseline. Able to complete supine to sit with min guard, then able to coax patient into one sit to stand with min guardx2 for safety, however patient quickly sat back down. Unable to convince him to perform an additional stand or ambulate in room, difficulty in convincing him to return to supine so he was returned to supine with [facility] [place]. Recommend SNF with 24/7 assist moving forward.     Follow Up Recommendations Other (comment)([facility])    Equipment Recommendations  None recommended by PT    Recommendations for Other Services       Precautions / Restrictions Precautions Precautions: Fall;Other (comment) Precaution Comments: severe dementia, combative and can be aggressive Restrictions Weight Bearing Restrictions: No      Mobility  Bed Mobility Overal bed mobility: Needs Assistance Bed Mobility: Supine to Sit;Sit to Supine     Supine to sit: Mod assist Sit to supine: Max assist;+2 for physical assistance   General bed mobility comments: all physical assist given due to cognitive status/limitations - patient appears to have functional strength necessary to perform tasks  Transfers Overall transfer level: Needs assistance Equipment used: None Transfers: Sit to/from Stand Sit to Stand: Min guard         General transfer comment: Min guardx2 for  safety, quickly sat back down, unable to convince him to stand again or ambulate  Ambulation/Gait             General Gait Details: patient declines, unable to convince him to participate in gait  Stairs            Wheelchair Mobility    Modified Rankin (Stroke Patients Only)       Balance Overall balance assessment: Needs assistance;History of Falls Sitting-balance support: Bilateral upper extremity supported;Feet supported Sitting balance-Leahy Scale: Good     Standing balance support: No upper extremity supported;During functional activity Standing balance-Leahy Scale: Fair                               Pertinent Vitals/Pain Pain Assessment: Faces Pain Score: 0-No pain Faces Pain Scale: No hurt Pain Intervention(s): Limited activity within patient's tolerance;Monitored during session    [place] expects to be discharged to:: Private residence Living Arrangements: Spouse/significant other               Additional Comments: patient poor historian, uanble to provide any history, family unavailable    Prior Function           Comments: unknown- patient unable to provide history, family unavailable     Hand Dominance        Extremity/Trunk Assessment   Upper Extremity Assessment Upper Extremity Assessment: Overall WFL for tasks assessed    Lower Extremity Assessment Lower Extremity Assessment: Overall WFL for tasks assessed    Cervical / Trunk Assessment Cervical / Trunk Assessment: Normal  Communication      Cognition Arousal/Alertness: Awake/alert Behavior  During Therapy: Flat affect Overall Cognitive Status: History of cognitive impairments - at baseline                                 General Comments: history of severe dementia      General Comments      Exercises     Assessment/Plan    PT Assessment All further PT needs can be met in the next venue of care  PT Problem List  Decreased strength;Decreased mobility;Decreased safety awareness;Decreased cognition;Decreased coordination;Decreased activity tolerance;Decreased balance       PT Treatment Interventions      PT Goals (Current goals can be found in the Care Plan section)  Acute Rehab PT Goals PT Goal Formulation: Patient unable to participate in goal setting    Frequency     Barriers to discharge        Co-evaluation               AM-PAC PT "6 Clicks" Mobility  Outcome Measure Help needed turning from your back to your side while in a flat bed without using bedrails?: A Little Help needed moving from lying on your back to sitting on the side of a flat bed without using bedrails?: A Little Help needed moving to and from a bed to a chair (including a wheelchair)?: A Little Help needed standing up from a chair using your arms (e.g., wheelchair or bedside chair)?: A Little Help needed to walk in hospital room?: A Little Help needed climbing 3-5 steps with a railing? : A Little 6 Click Score: 18    End of Session   Activity Tolerance: Other (comment)(session limited due to cognition/cognitive status) Patient left: in bed;with call bell/phone within reach;with bed alarm set;with nursing/sitter in room   PT Visit Diagnosis: Unsteadiness on feet (R26.81);Muscle weakness (generalized) (M62.81);Difficulty in walking, not elsewhere classified (R26.2);History of falling (Z91.81)    Time: 1335-1350 PT Time Calculation (min) (ACUTE ONLY): 15 min   Charges:   PT Evaluation $PT Eval Moderate Complexity: 1 Mod           Deniece Ree PT, DPT, CBIS  Supplemental Physical Therapist [place]    Pager [phone] Acute Rehab Office [phone]

## 2018-11-30 NOTE — ED Provider Notes (Signed)
63 YOM here with dementia.  Please see previous providers note for full H&P.  [facility] will be transferred via repeat R.  Patient resting comfortably in exam bed at the time of discharge.  Vitals:   11/30/18 1144 11/30/18 1419  BP: (!) 125/91   Pulse: 81   Resp: 18 17  Temp:  97.9 F (36.6 C)  SpO2: 99%      Okey Regal, PA-C 11/30/18 1435    Drenda Freeze, MD 11/30/18 1455

## 2018-11-30 NOTE — ED Notes (Signed)
PTAR arrived for this pt.

## 2018-11-30 NOTE — ED Notes (Signed)
Pt was given Ativan for sedation and have placed a NT as sitter with patient. Pt is calm and sleeping.  Pt in no distress.

## 2018-11-30 NOTE — ED Notes (Signed)
Called to follow up with PTAR. They said patient was about the third one down so it will shouldn't be much longer

## 2018-11-30 NOTE — ED Notes (Signed)
Pt is resting in bed and no aggitation. Sitter at bedside.

## 2018-11-30 NOTE — ED Notes (Signed)
Pt given bed bath and sheets changed.

## 2018-11-30 NOTE — Progress Notes (Signed)
CSW spoke with RN CM who will place PT/OT orders for this patient to have him evaluated to determine the appropriate level of care that he needs. CSW will wait for PT/OT to complete their evaluations and will proceed with placement process if that need is indicated.  Madilyn Fireman, MSW, LCSW-A Clinical Social Worker [facility] [phone]

## 2018-11-30 NOTE — ED Notes (Signed)
Breakfast ordered 

## 2018-11-30 NOTE — Progress Notes (Addendum)
CSW received call from [place] in admissions at [facility] in [place] stating the facility can accept this patient without a PT evaluation because placement was being pursued prior to him coming to [facility] ED with his PCP.   Patient will go to room 410 at [facility] via [place] at 3:30pm today. The number to call for report is [phone]. Please fax patient's negative COVID results and AVS to [phone].   CSW spoke with patient's RN Percell Locus to inform her of plan. CSW spoke with patient's wife Malachy Mood to inform her of discharge plan, Malachy Mood is agreeable.   Madilyn Fireman, MSW, LCSW-A Clinical Social Worker [facility] [phone]

## 2018-11-30 NOTE — ED Notes (Signed)
Patient verbalizes understanding of discharge instructions. Opportunity for questioning and answers were provided. Armband removed by staff, pt discharged from ED.  

## 2018-11-30 NOTE — ED Notes (Signed)
Meal tray bedside, pt sleep at the moment

## 2018-11-30 NOTE — ED Notes (Signed)
Sitter at bedside.  Pt is drowsy and resting currently.

## 2018-11-30 NOTE — Progress Notes (Signed)
PT Cancellation Note  Patient Details Name: Brian [name] MRN: [id number] DOB: 01/05/1951   Cancelled Treatment:    Reason Eval/Treat Not Completed: Other (comment) RN reports patient has had Ativan, had been super agitated and combative prior to this medicine, has been sleeping soundly since getting medication. Requests that PT return later in day. Will attempt to return if time/schedule allow.    Deniece Ree PT, DPT, CBIS  Supplemental Physical Therapist [facility]    Pager [phone] Acute Rehab Office [phone]

## 2018-12-01 DIAGNOSIS — G309 Alzheimer's disease, unspecified: Secondary | ICD-10-CM | POA: Diagnosis not present

## 2018-12-01 DIAGNOSIS — F329 Major depressive disorder, single episode, unspecified: Secondary | ICD-10-CM | POA: Diagnosis not present

## 2018-12-01 DIAGNOSIS — F0390 Unspecified dementia without behavioral disturbance: Secondary | ICD-10-CM | POA: Diagnosis not present

## 2018-12-01 DIAGNOSIS — F0280 Dementia in other diseases classified elsewhere without behavioral disturbance: Secondary | ICD-10-CM | POA: Diagnosis not present

## 2018-12-02 DIAGNOSIS — M79642 Pain in left hand: Secondary | ICD-10-CM | POA: Diagnosis not present

## 2018-12-07 DIAGNOSIS — G309 Alzheimer's disease, unspecified: Secondary | ICD-10-CM | POA: Diagnosis not present

## 2018-12-07 DIAGNOSIS — F0280 Dementia in other diseases classified elsewhere without behavioral disturbance: Secondary | ICD-10-CM | POA: Diagnosis not present

## 2018-12-07 DIAGNOSIS — C729 Malignant neoplasm of central nervous system, unspecified: Secondary | ICD-10-CM | POA: Diagnosis not present

## 2018-12-07 DIAGNOSIS — F0390 Unspecified dementia without behavioral disturbance: Secondary | ICD-10-CM | POA: Diagnosis not present

## 2019-01-16 DEATH — deceased

## 2020-01-19 IMAGING — CT CT HEAD W/O CM
4 series · 17 of 47 positions shown, 19 images · non-contrast
Comparison: CT head 11/12/2018.

CLINICAL DATA: Unexplained altered level of consciousness. Multiple
falls yesterday. Increased agitation.

EXAM:
CT HEAD WITHOUT CONTRAST
TECHNIQUE: Contiguous axial images were obtained from the base of the skull
through the vertex without intravenous contrast.

[Series 3: head without · axial · non-contrast · 0.44mm/px · z∈[-64,+66]mm · 7 of 36 slices shown, 9 images]
[im 5/36  brain]
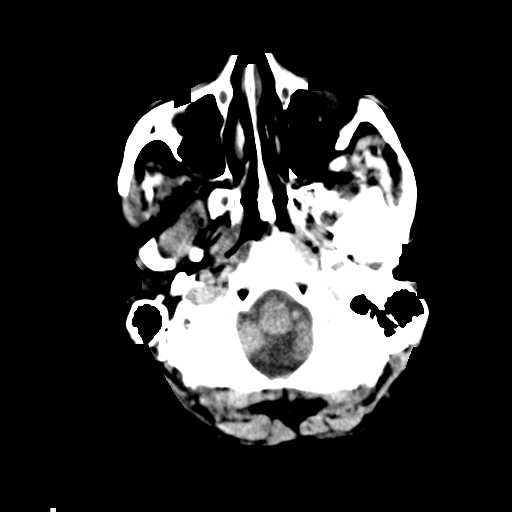
[im 5/36  bone]
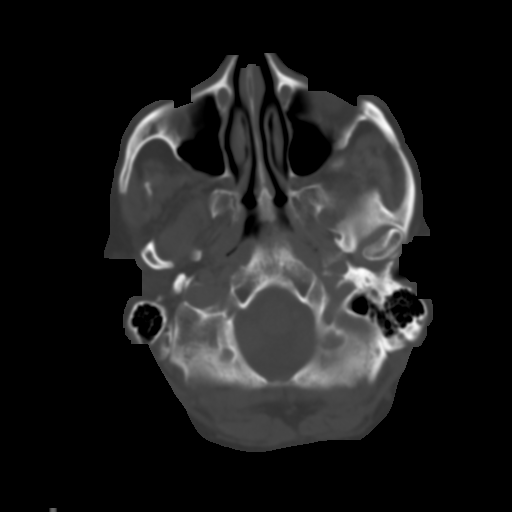
[im 9/36  brain]
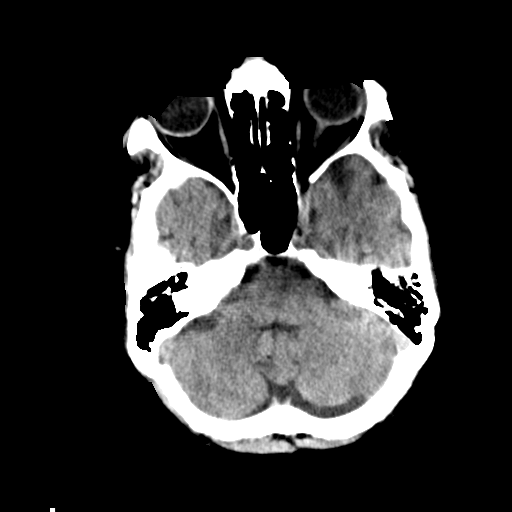
[im 14/36  brain]
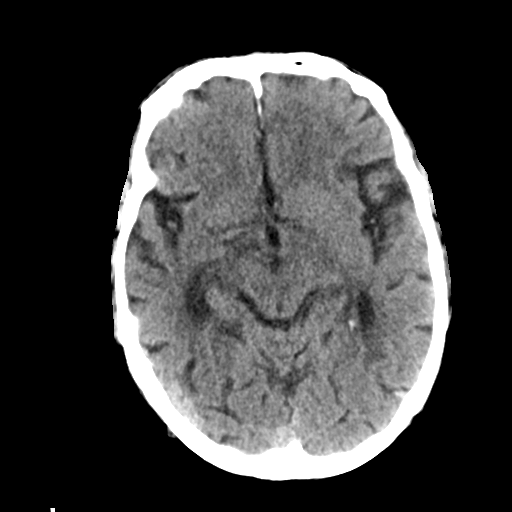
[im 18/36  brain]
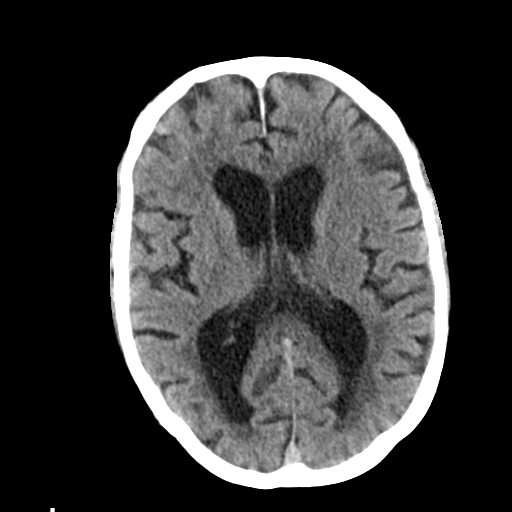
[im 22/36  brain]
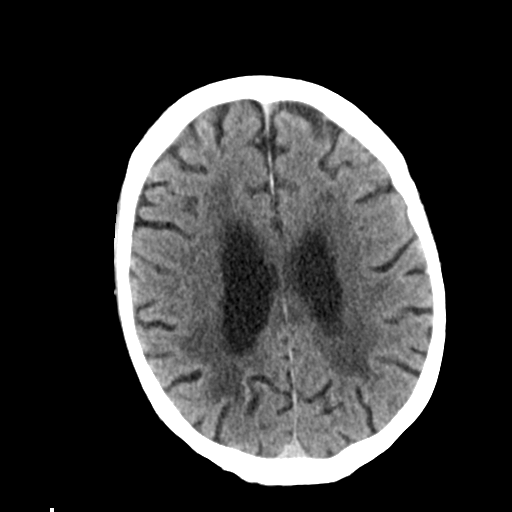
[im 22/36  bone]
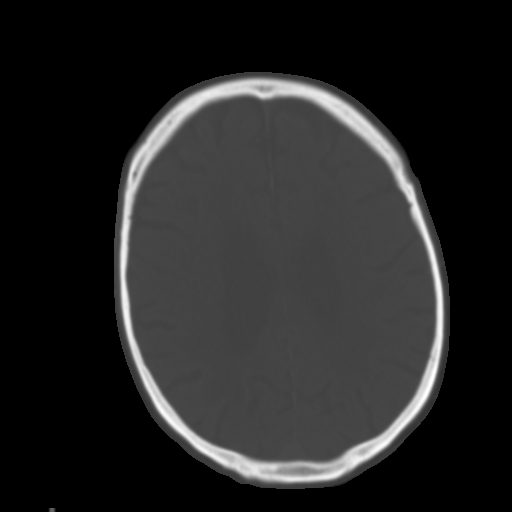
[im 27/36  brain]
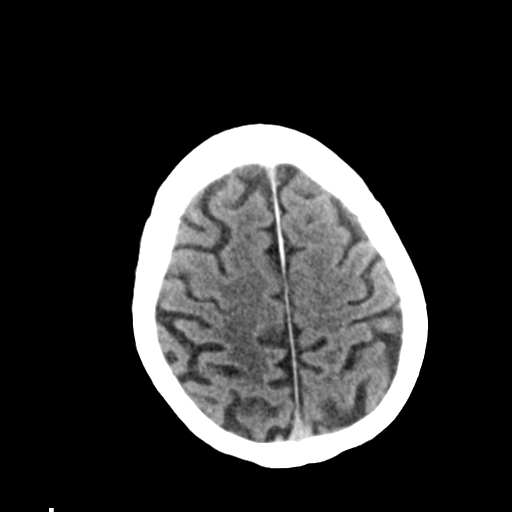
[im 31/36  brain]
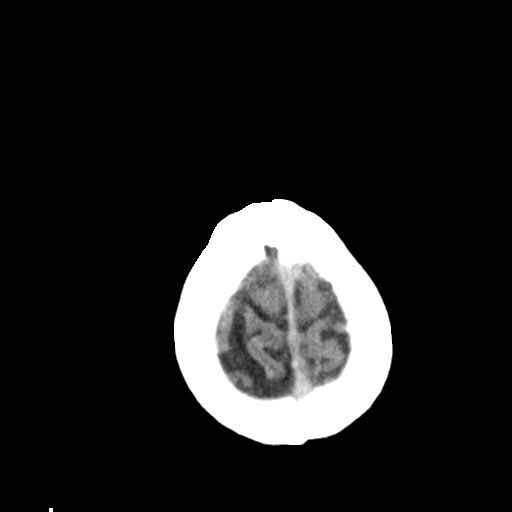

[Series 4: head bone · axial · 0.44mm/px · z∈[-68,-6]mm · 4 of 88 slices shown]
[im 9/88  bone]
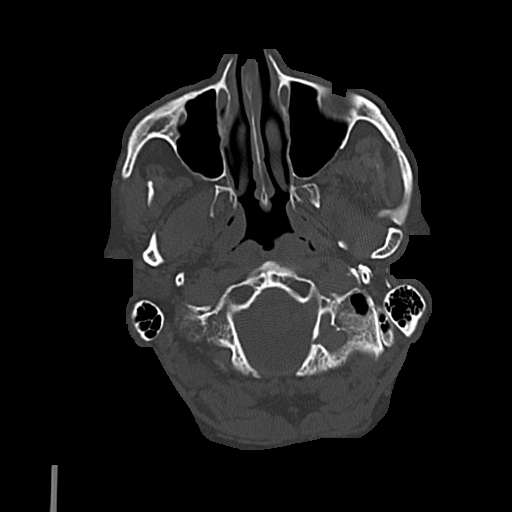
[im 18/88  bone]
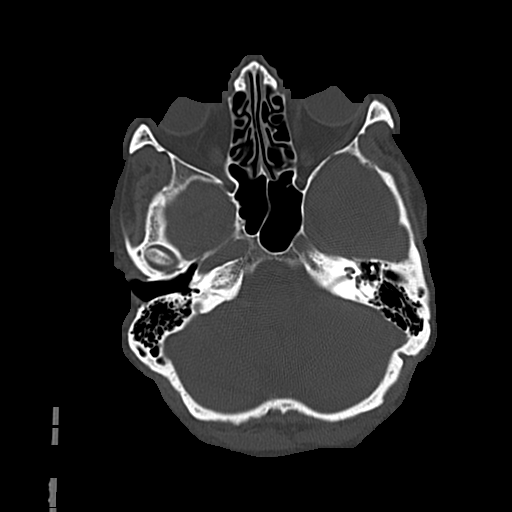
[im 27/88  bone]
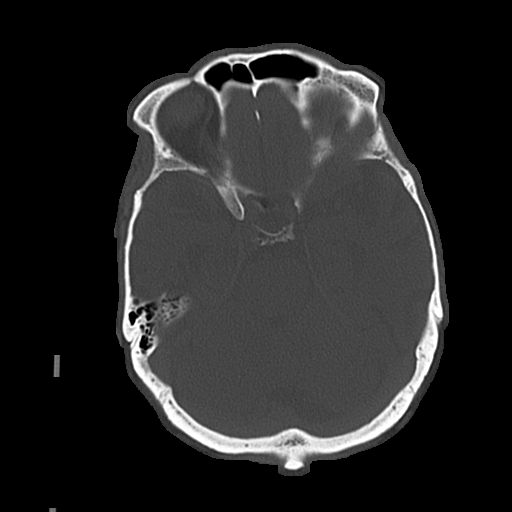
[im 40/88  bone]
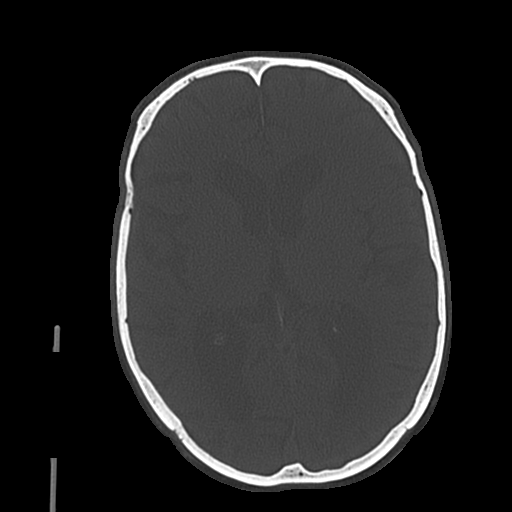

[Series 5: head without cor · coronal · non-contrast · 0.34mm/px · 3 of 70 slices shown]
[im 24/70  brain]
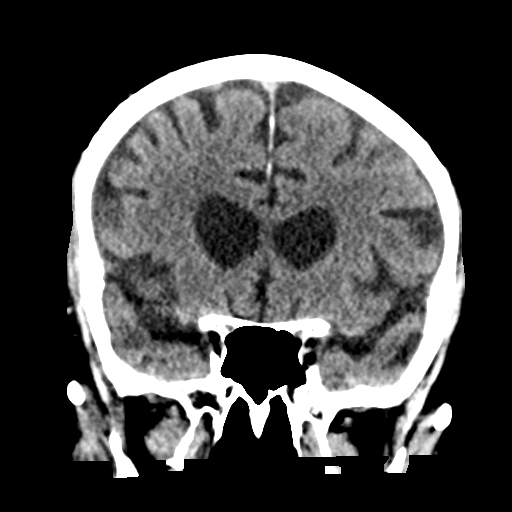
[im 31/70  brain]
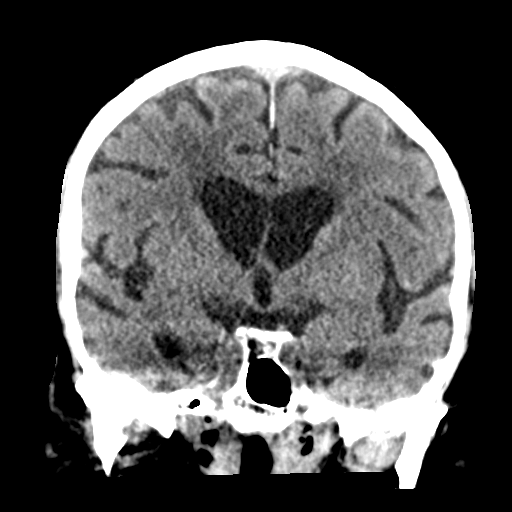
[im 39/70  brain]
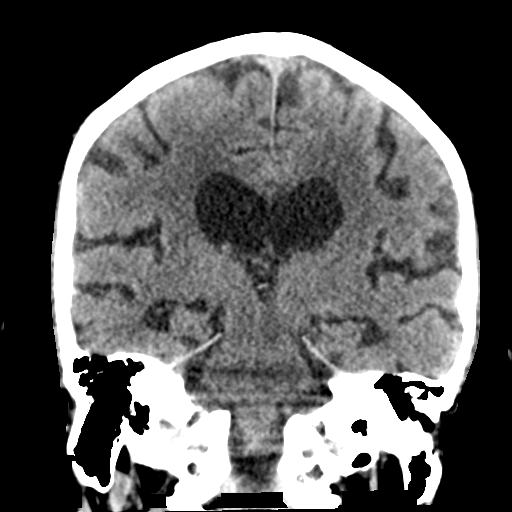

[Series 6: head without sag · sagittal · non-contrast · 0.32mm/px · 3 of 57 slices shown]
[im 19/57  brain]
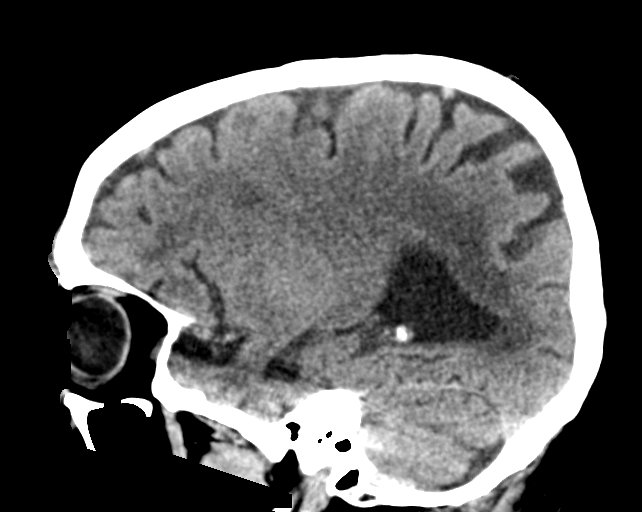
[im 29/57  brain]
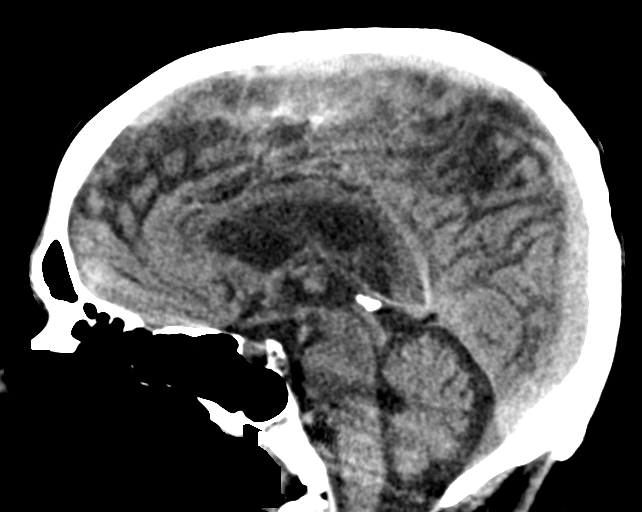
[im 38/57  brain]
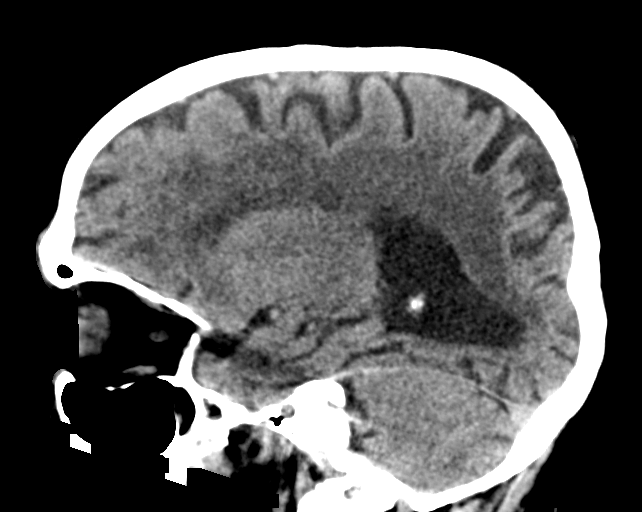

[17 of 47 positions shown; findings below may reference images not displayed]

FINDINGS: Brain: There is no evidence of acute intracranial hemorrhage, mass
lesion, brain edema or extra-axial fluid collection. There is stable
atrophy with prominence of ventricles and subarachnoid spaces. There
are stable chronic small vessel ischemic changes in the
periventricular and subcortical white matter. There is no CT
evidence of acute cortical infarction.

Vascular: Mild intracranial atherosclerosis. No hyperdense vessel
identified.

Skull: Negative for fracture or focal lesion.

Sinuses/Orbits: The visualized paranasal sinuses and mastoid air
cells are clear. No orbital abnormalities are seen.

Other: None.
IMPRESSION: Stable head CT without acute intracranial findings. Chronic atrophy
and small vessel ischemic changes.
